# Patient Record
Sex: Male | Born: 1950 | Race: Black or African American | Hispanic: No | Marital: Single | Smoking: Former smoker
Health system: Southern US, Community
[De-identification: ages and names within clinical notes are randomized; demographics above are authoritative.]

## PROBLEM LIST (undated history)

## (undated) DIAGNOSIS — E039 Hypothyroidism, unspecified: Secondary | ICD-10-CM

## (undated) DIAGNOSIS — M1990 Unspecified osteoarthritis, unspecified site: Secondary | ICD-10-CM

## (undated) DIAGNOSIS — E079 Disorder of thyroid, unspecified: Secondary | ICD-10-CM

## (undated) HISTORY — PX: NO PAST SURGERIES: SHX2092

---

## 1999-11-02 ENCOUNTER — Other Ambulatory Visit: Admission: RE | Admit: 1999-11-02 | Discharge: 1999-11-02 | Payer: Self-pay | Admitting: Gastroenterology

## 2003-04-25 ENCOUNTER — Emergency Department (HOSPITAL_COMMUNITY): Admission: EM | Admit: 2003-04-25 | Discharge: 2003-04-25 | Payer: Self-pay | Admitting: Emergency Medicine

## 2019-09-29 ENCOUNTER — Inpatient Hospital Stay (HOSPITAL_COMMUNITY)
Admission: EM | Admit: 2019-09-29 | Discharge: 2019-10-01 | DRG: 176 | Disposition: A | Discharge status: Home / Self Care | Payer: No Typology Code available for payment source | Attending: Family Medicine | Admitting: Family Medicine

## 2019-09-29 ENCOUNTER — Encounter (HOSPITAL_COMMUNITY): Payer: Self-pay

## 2019-09-29 ENCOUNTER — Emergency Department (HOSPITAL_COMMUNITY): Payer: No Typology Code available for payment source

## 2019-09-29 ENCOUNTER — Other Ambulatory Visit: Payer: Self-pay

## 2019-09-29 DIAGNOSIS — I2699 Other pulmonary embolism without acute cor pulmonale: Secondary | ICD-10-CM | POA: Diagnosis not present

## 2019-09-29 DIAGNOSIS — F1020 Alcohol dependence, uncomplicated: Secondary | ICD-10-CM | POA: Diagnosis present

## 2019-09-29 DIAGNOSIS — Z20822 Contact with and (suspected) exposure to covid-19: Secondary | ICD-10-CM | POA: Diagnosis present

## 2019-09-29 DIAGNOSIS — E039 Hypothyroidism, unspecified: Secondary | ICD-10-CM | POA: Diagnosis not present

## 2019-09-29 DIAGNOSIS — Z79899 Other long term (current) drug therapy: Secondary | ICD-10-CM

## 2019-09-29 DIAGNOSIS — Z720 Tobacco use: Secondary | ICD-10-CM

## 2019-09-29 DIAGNOSIS — F17290 Nicotine dependence, other tobacco product, uncomplicated: Secondary | ICD-10-CM | POA: Diagnosis present

## 2019-09-29 DIAGNOSIS — Z7289 Other problems related to lifestyle: Secondary | ICD-10-CM | POA: Diagnosis not present

## 2019-09-29 DIAGNOSIS — Z79890 Hormone replacement therapy (postmenopausal): Secondary | ICD-10-CM

## 2019-09-29 DIAGNOSIS — Z789 Other specified health status: Secondary | ICD-10-CM

## 2019-09-29 DIAGNOSIS — E876 Hypokalemia: Secondary | ICD-10-CM | POA: Diagnosis present

## 2019-09-29 DIAGNOSIS — F1090 Alcohol use, unspecified, uncomplicated: Secondary | ICD-10-CM

## 2019-09-29 HISTORY — DX: Hypothyroidism, unspecified: E03.9

## 2019-09-29 HISTORY — DX: Unspecified osteoarthritis, unspecified site: M19.90

## 2019-09-29 HISTORY — DX: Disorder of thyroid, unspecified: E07.9

## 2019-09-29 LAB — MAGNESIUM: Magnesium: 2.1 mg/dL (ref 1.7–2.4)

## 2019-09-29 LAB — TROPONIN I (HIGH SENSITIVITY)
Troponin I (High Sensitivity): 5 ng/L (ref <18)
Troponin I (High Sensitivity): 3 ng/L (ref <18)

## 2019-09-29 LAB — CBC
HCT: 50.3 % (ref 39.0–52.0)
Hemoglobin: 16.5 g/dL (ref 13.0–17.0)
MCH: 31.1 pg (ref 26.0–34.0)
MCHC: 32.8 g/dL (ref 30.0–36.0)
MCV: 94.9 fL (ref 80.0–100.0)
Platelets: 233 10*3/uL (ref 150–400)
RBC: 5.3 MIL/uL (ref 4.22–5.81)
RDW: 17.5 % — ABNORMAL HIGH (ref 11.5–15.5)
WBC: 8 10*3/uL (ref 4.0–10.5)
nRBC: 0 % (ref 0.0–0.2)

## 2019-09-29 LAB — BASIC METABOLIC PANEL
Anion gap: 13 (ref 5–15)
BUN: <5 mg/dL — ABNORMAL LOW (ref 8–23)
CO2: 24 mmol/L (ref 22–32)
Calcium: 9.1 mg/dL (ref 8.9–10.3)
Chloride: 99 mmol/L (ref 98–111)
Creatinine, Ser: 0.88 mg/dL (ref 0.61–1.24)
GFR calc Af Amer: >60 mL/min (ref >60)
GFR calc non Af Amer: >60 mL/min (ref >60)
Glucose, Bld: 88 mg/dL (ref 70–99)
Potassium: 3.8 mmol/L (ref 3.5–5.1)
Sodium: 136 mmol/L (ref 135–145)

## 2019-09-29 LAB — PHOSPHORUS: Phosphorus: 3.6 mg/dL (ref 2.5–4.6)

## 2019-09-29 LAB — D-DIMER, QUANTITATIVE: D-Dimer, Quant: 4.61 ug/mL-FEU — ABNORMAL HIGH (ref 0.00–0.50)

## 2019-09-29 LAB — SARS CORONAVIRUS 2 BY RT PCR (HOSPITAL ORDER, PERFORMED IN ~~LOC~~ HOSPITAL LAB): SARS Coronavirus 2: NEGATIVE

## 2019-09-29 MED ORDER — LORAZEPAM 2 MG/ML IJ SOLN: 1.0000 mg | INTRAMUSCULAR | Status: DC | PRN

## 2019-09-29 MED ORDER — FOLIC ACID 1 MG PO TABS
1.0000 mg | ORAL_TABLET | Freq: Every day | ORAL | Status: DC
  Administered 2019-09-30 – 2019-10-01 (×2): 1 mg via ORAL
  Filled 2019-09-29 (×2): qty 1

## 2019-09-29 MED ORDER — PROMETHAZINE HCL 25 MG PO TABS: 12.5000 mg | ORAL_TABLET | Freq: Four times a day (QID) | ORAL | Status: DC | PRN

## 2019-09-29 MED ORDER — HEPARIN BOLUS VIA INFUSION
4000.0000 [IU] | Freq: Once | INTRAVENOUS | Status: AC
  Administered 2019-09-29: 4000 [IU] via INTRAVENOUS
  Filled 2019-09-29: qty 4000

## 2019-09-29 MED ORDER — NICOTINE 14 MG/24HR TD PT24
14.0000 mg | MEDICATED_PATCH | Freq: Every day | TRANSDERMAL | Status: DC
  Administered 2019-09-30 – 2019-10-01 (×2): 14 mg via TRANSDERMAL
  Filled 2019-09-29 (×3): qty 1

## 2019-09-29 MED ORDER — IOHEXOL 350 MG/ML SOLN
100.0000 mL | Freq: Once | INTRAVENOUS | Status: AC | PRN
  Administered 2019-09-29: 100 mL via INTRAVENOUS

## 2019-09-29 MED ORDER — HYDROCODONE-ACETAMINOPHEN 5-325 MG PO TABS: 1.0000 | ORAL_TABLET | ORAL | Status: DC | PRN

## 2019-09-29 MED ORDER — LORAZEPAM 1 MG PO TABS: 1.0000 mg | ORAL_TABLET | ORAL | Status: DC | PRN

## 2019-09-29 MED ORDER — ALBUTEROL SULFATE (2.5 MG/3ML) 0.083% IN NEBU: 3.0000 mL | INHALATION_SOLUTION | RESPIRATORY_TRACT | Status: DC | PRN

## 2019-09-29 MED ORDER — THIAMINE HCL 100 MG/ML IJ SOLN
Freq: Once | INTRAVENOUS | Status: AC
  Filled 2019-09-29: qty 1000

## 2019-09-29 MED ORDER — IBUPROFEN 200 MG PO TABS: 400.0000 mg | ORAL_TABLET | Freq: Four times a day (QID) | ORAL | Status: DC | PRN

## 2019-09-29 MED ORDER — BENZONATATE 100 MG PO CAPS
100.0000 mg | ORAL_CAPSULE | Freq: Once | ORAL | Status: AC
  Administered 2019-09-29: 100 mg via ORAL
  Filled 2019-09-29: qty 1

## 2019-09-29 MED ORDER — ADULT MULTIVITAMIN W/MINERALS CH
1.0000 | ORAL_TABLET | Freq: Every day | ORAL | Status: DC
  Administered 2019-09-30 – 2019-10-01 (×2): 1 via ORAL
  Filled 2019-09-29 (×2): qty 1

## 2019-09-29 MED ORDER — HEPARIN (PORCINE) 25000 UT/250ML-% IV SOLN
1500.0000 [IU]/h | INTRAVENOUS | Status: DC
  Administered 2019-09-29: 1200 [IU]/h via INTRAVENOUS
  Administered 2019-09-30 – 2019-10-01 (×2): 1500 [IU]/h via INTRAVENOUS
  Filled 2019-09-29 (×3): qty 250

## 2019-09-29 MED ORDER — ASPIRIN EC 81 MG PO TBEC
81.0000 mg | DELAYED_RELEASE_TABLET | Freq: Every day | ORAL | Status: DC
  Administered 2019-09-29 – 2019-10-01 (×3): 81 mg via ORAL
  Filled 2019-09-29 (×3): qty 1

## 2019-09-29 MED ORDER — THIAMINE HCL 100 MG PO TABS
100.0000 mg | ORAL_TABLET | Freq: Every day | ORAL | Status: DC
  Administered 2019-09-30 – 2019-10-01 (×2): 100 mg via ORAL
  Filled 2019-09-29 (×2): qty 1

## 2019-09-29 NOTE — ED Provider Notes (Signed)
[facility] EMERGENCY DEPARTMENT Provider Note   CSN: [id number] Arrival date & time: 09/29/19  4098     History Chief Complaint  Patient presents with  . Shortness of Breath  . Chest Pain    Chad Meyer is a 69 y.o. male.  The history is provided by the patient. No language interpreter was used.  Shortness of Breath Associated symptoms: chest pain   Chest Pain Associated symptoms: shortness of breath      69 year old male with history of thyroid disease presents ED for evaluation of shortness of breath.  Patient report acute onset of right-sided pleuritic chest pain and cough productive with phlegm that started approximately 4 days ago.  Pain is moderate in severity worse when he coughs and he is endorsing shortness of breath.  Pain is nonradiating, no hemoptysis no leg swelling or calf pain.  Denies any nausea vomiting diarrhea loss of taste or smell.  He has had his Covid vaccination.  He does smoke blacking out.  He denies any prior history of DVT PE no recent surgery or prolonged bedrest.  No active cancer.  Patient felt his symptoms started when he had a fan blowing on him while he was sleeping several days prior.  He also mentioned his roommate tested positive for COVID-19 5 months ago.  He denies any specific treatment tried at home.  Past Medical History:  Diagnosis Date  . Thyroid disease     There are no problems to display for this patient.   The histories are not reviewed yet. Please review them in the "History" navigator section and refresh this SmartLink.     No family history on file.  Social History   Tobacco Use  . Smoking status: Not on file  Substance Use Topics  . Alcohol use: Not on file  . Drug use: Not on file    Home Medications Prior to Admission medications   Not on File    Allergies    Patient has no allergy information on record.  Review of Systems   Review of Systems  Respiratory: Positive for shortness of  breath.   Cardiovascular: Positive for chest pain.  All other systems reviewed and are negative.   Physical Exam Updated Vital Signs BP (!) 141/76 (BP Location: Right Arm)   Pulse 82   Temp 99.3 F (37.4 C) (Oral)   Resp (!) 21   Ht 6' (1.829 m)   Wt 70.3 kg   SpO2 97%   BMI 21.02 kg/m   Physical Exam Vitals and nursing note reviewed.  Constitutional:      General: He is not in acute distress.    Appearance: He is well-developed.  HENT:     Head: Atraumatic.  Eyes:     Conjunctiva/sclera: Conjunctivae normal.  Cardiovascular:     Rate and Rhythm: Normal rate and regular rhythm.  Pulmonary:     Effort: Pulmonary effort is normal.     Breath sounds: Examination of the right-lower field reveals rales. Rales present. No wheezing or rhonchi.  Chest:     Chest wall: No tenderness.  Abdominal:     Palpations: Abdomen is soft.     Tenderness: There is no abdominal tenderness.  Musculoskeletal:     Cervical back: Neck supple.     Right lower leg: No edema.     Left lower leg: No edema.  Skin:    Findings: No rash.  Neurological:     Mental Status: He is alert and  oriented to person, place, and time.  Psychiatric:        Mood and Affect: Mood normal.     ED Results / Procedures / Treatments   Labs (all labs ordered are listed, but only abnormal results are displayed) Labs Reviewed  BASIC METABOLIC PANEL - Abnormal; Notable for the following components:      Result Value   BUN <5 (*)    All other components within normal limits  CBC - Abnormal; Notable for the following components:   RDW 17.5 (*)    All other components within normal limits  D-DIMER, QUANTITATIVE (NOT AT [facility]) - Abnormal; Notable for the following components:   D-Dimer, Quant 4.61 (*)    All other components within normal limits  SARS CORONAVIRUS 2 BY RT PCR (HOSPITAL ORDER, PERFORMED IN [facility])  TROPONIN I (HIGH SENSITIVITY)  TROPONIN I (HIGH SENSITIVITY)    EKG EKG  Interpretation  Date/Time:  Wednesday September 29 2019 09:05:25 EDT Ventricular Rate:  98 PR Interval:  160 QRS Duration: 70 QT Interval:  350 QTC Calculation: 446 R Axis:   21 Text Interpretation: Sinus rhythm with occasional Premature ventricular complexes Right atrial enlargement Anteroseptal infarct , age undetermined Abnormal ECG Confirmed by Virgina Norfolk [phone]) on 09/29/2019 3:37:14 PM   Radiology DG Chest 2 View  Addendum Date: 09/29/2019   ADDENDUM REPORT: 09/29/2019 10:20 ADDENDUM: With a more focal opacity in the RIGHT upper lobe infection is favored. Suggest follow-up to ensure resolution. These results were called by telephone at the time of interpretation on 09/29/2019 at 10:20 am to provider [facility] , who verbally acknowledged these results. Electronically Signed   By: Donzetta Kohut M.D.   On: 09/29/2019 10:20   Result Date: 09/29/2019 CLINICAL DATA:  RIGHT lateral chest pain soreness and shortness of breath with productive cough for 3-4 days EXAM: CHEST - 2 VIEW COMPARISON:  None FINDINGS: Trachea is midline. Cardiomediastinal contours are normal. Mild fullness of RIGHT and LEFT hila. Inter stool markings are increased bilaterally with hazy density in the RIGHT upper chest. No lobar consolidation. No sign of pleural effusion. Convex margin along the RIGHT heart border overlying the RIGHT hilum likely ectatic thoracic aorta. On limited assessment visualized skeletal structures without acute process. IMPRESSION: Increased interstitial markings suggested throughout without signs of edema or effusion. Hazy density in the RIGHT upper chest as described. Findings raise the question of background interstitial disease or though could be seen with atypical infection or asymmetric edema. Aortic ectasia. Electronically Signed: By: Donzetta Kohut M.D. On: 09/29/2019 09:37    Procedures .Critical Care Performed by: Fayrene Helper, PA-C Authorized by: Fayrene Helper, PA-C   Critical care provider  statement:    Critical care time (minutes):  35   Critical care was time spent personally by me on the following activities:  Discussions with consultants, evaluation of patient's response to treatment, examination of patient, ordering and performing treatments and interventions, ordering and review of laboratory studies, ordering and review of radiographic studies, pulse oximetry, re-evaluation of patient's condition, obtaining history from patient or surrogate and review of old charts   (including critical care time)  Medications Ordered in ED Medications  benzonatate (TESSALON) capsule 100 mg (100 mg Oral Given 09/29/19 1623)  iohexol (OMNIPAQUE) 350 MG/ML injection 100 mL (100 mLs Intravenous Contrast Given 09/29/19 1821)    ED Course  I have reviewed the triage vital signs and the nursing notes.  Pertinent labs & imaging results that were available  during my care of the patient were reviewed by me and considered in my medical decision making (see chart for details).    MDM Rules/Calculators/A&P                          BP 137/67   Pulse 70   Temp 98.4 F (36.9 C) (Oral)   Resp (!) 25   Ht 6' (1.829 m)   Wt 70.3 kg   SpO2 96%   BMI 21.02 kg/m   Final Clinical Impression(s) / ED Diagnoses Final diagnoses:  Pulmonary embolism with infarction (HCC)    Rx / DC Orders ED Discharge Orders    None     4:17 PM Patient complaining of right-sided pleuritic chest pain and cough for the past several days.  Labs are reassuring.  Chest x-ray shows a focal opacity to the right upper lobe suggesting infection.  We will also obtain D-dimer to assess for potential PE.  6:58 PM D-dimer is markedly elevated at 4.61.  A subsequent chest CT angiogram obtained demonstrating right upper lobe pulmonary embolism with pulmonary infarct consistent with patient's presentation. Pt does have signs of Right heart strain, will start heparin and will consult for admission.  His COVID-19 test is negative.     8:44 PM I have consulted critical care intensivist, Dr. Raeford Razor who request medicine for admission.  Echocardiogram ordered.  Patient started on heparin.  Appreciate consultation from [facility] hospitalist, Dr. Rachael Darby who agrees to admit patient for further management.  Patient voiced understanding and agrees with plan.  BP (!) 153/88   Pulse 82   Temp 98.4 F (36.9 C) (Oral)   Resp 19   Ht 6' (1.829 m)   Wt 70.3 kg   SpO2 97%   BMI 21.02 kg/m   Chad Meyer was evaluated in Emergency Department on 09/29/2019 for the symptoms described in the history of present illness. He was evaluated in the context of the global COVID-19 pandemic, which necessitated consideration that the patient might be at risk for infection with the SARS-CoV-2 virus that causes COVID-19. Institutional protocols and algorithms that pertain to the evaluation of patients at risk for COVID-19 are in a state of rapid change based on information released by regulatory bodies including the CDC and federal and state organizations. These policies and algorithms were followed during the patient's care in the ED.    Fayrene Helper, PA-C 09/29/19 2045    Virgina Norfolk, DO 09/29/19 2220

## 2019-09-29 NOTE — H&P (Signed)
History and Physical    Chad Meyer ZOX:[id number] DOB: 1950/04/14 DOA: 09/29/2019  PCP: Patient, No Pcp Per (Confirm with patient/family/NH records and if not entered, this has to be [facility]) Patient coming from: Home   I have personally briefly reviewed patient's old medical records in [facility]  Chief Complaint: Shortness of breath  HPI: Chad Meyer is a 69 y.o. male with medical history significant of hypothyroidism, alcohol use, osteoarthritis who presents from home with complaint of shortness of breath.  He reports that when he woke up this morning he felt short of breath with a sharp pain in the right lower lateral chest region.  He states if he sat down and rested he was able to breathe fine but if he got up and walked or exert himself he would become short of breath.  As of breath improves when he sits down and rest.  Reports shortness of breath is mild to moderate at its worst.  He has not had any swelling of his legs or leg pain.  He also reports a cough with some thick white sputum which is not normal for him.  He denies any chest pressure or palpitations.  States he has not had any recent travel or prolonged immobilization.  Reports he has never had blood clots in the past.  Reports he does take a multivitamin a day vitamin D and Synthroid and has been compliant with his medications.  He lives alone.  He smokes 3-4 cigars a day.  Reports he drinks 2-4  24 ounce beers most days of the week.  He states he has never had withdrawals or DTs if he does not drink.  He denies illicit drug use.    ED Course: Mr. Chad Meyer presented with shortness of breath.  He has not been hypoxic and did not require oxygen in the emergency room.  He had negative troponin, negative COVID-19 swab.  He has elevated D-dimer level.  He has been hemodynamically stable.  CT angiography was obtained which showed submassive PE with right heart strain.  ER provider discussed with critical care  who did not feel patient required ICU admission with no hypoxia or oxygen requirement.  Patient was started heparin infusion in the emergency room  Review of Systems:  General: Denies weakness, fever, chills, weight loss, night sweats.  Denies dizziness.  Eyes change in appetite HENT: Denies head trauma, headache, denies change in hearing, tinnitus.  Denies nasal congestion or bleeding.  Denies sore throat, sores in mouth.  Denies difficulty swallowing Eyes: Denies blurry vision, pain in eye, drainage.  Denies discoloration of eyes. Neck: Denies pain.  Denies swelling.  Denies pain with movement. Cardiovascular: Reports mild to moderate right lower lateral chest pain.denies palpitations.  Denies edema.  Denies orthopnea Respiratory: Reports shortness of breath.  Reports productive cough.  Denies wheezing.  Gastrointestinal: Denies abdominal pain, swelling.  Denies nausea, vomiting, diarrhea.  Denies melena.  Denies hematemesis. Musculoskeletal: Denies limitation of movement.  Denies deformity or swelling.  Denies pain.  Denies arthralgias or myalgias. Genitourinary: Denies pelvic pain.  Denies urinary frequency or hesitancy.  Denies dysuria.  Skin: Denies rash.  Denies petechiae, purpura, ecchymosis. Neurological: Denies headache.  Denies syncope.  Denies seizure activity.  Denies weakness or paresthesia.  Slurred speech, drooping face.  Denies visual change. Psychiatric: Denies depression, anxiety.  Denies suicidal thoughts or ideation.  Denies hallucinations.  Past Medical History:  Diagnosis Date  . Arthritis   . Hypothyroidism   .  Thyroid disease     Past Surgical History:  Procedure Laterality Date  . NO PAST SURGERIES      Social History  reports that he has been smoking cigars. He has never used smokeless tobacco. He reports current alcohol use. He reports that he does not use drugs.  No Known Allergies  Family History  Problem Relation Age of Onset  . Hypertension Mother    . Hypertension Father   . CAD Father     Prior to Admission medications   Medication Sig Start Date End Date Taking? Authorizing Provider  Cholecalciferol (VITAMIN D3) 50 MCG (2000 UT) TABS Take 2,000 Units by mouth daily.   Yes [provider]  levothyroxine (SYNTHROID) 125 MCG tablet Take 250 mcg by mouth daily before breakfast.   Yes [provider]  sildenafil (VIAGRA) 100 MG tablet Take 50 mg by mouth daily as needed for erectile dysfunction.   Yes [provider]  vitamin B-12 (CYANOCOBALAMIN) 500 MCG tablet Take 500 mcg by mouth daily.   Yes [provider]    Physical Exam: Vitals:   09/29/19 1630 09/29/19 1700 09/29/19 1900 09/29/19 1915  BP: 138/76 137/67 (!) 142/80 (!) 153/88  Pulse: 76 70 81 82  Resp: (!) 21 (!) 25 (!) 29 19  Temp: 98.4 F (36.9 C)     TempSrc: Oral     SpO2: 98% 96% 98% 97%  Weight:      Height:         Constitutional: WDWN, AA male. NAD, calm, comfortable Vitals:   09/29/19 1630 09/29/19 1700 09/29/19 1900 09/29/19 1915  BP: 138/76 137/67 (!) 142/80 (!) 153/88  Pulse: 76 70 81 82  Resp: (!) 21 (!) 25 (!) 29 19  Temp: 98.4 F (36.9 C)     TempSrc: Oral     SpO2: 98% 96% 98% 97%  Weight:      Height:       Eyes: PERRL, lids and conjunctivae normal.  Sclera nonicteric.  No drainage ENMT:  [place],AT.  Nares patent without epistasis.  Ears without external lesions.  Mucous membranes are moist. Posterior pharynx clear of any exudate or lesions. Normal dentition.  Neck: Soft, supple, no masses, no thyromegaly.  Trachea midline Respiratory: Breath sounds equal clear.  No rales.  No wheezing, no crackles. Normal respiratory effort. No accessory muscle use.  Cardiovascular: Regular rate and rhythm, no murmurs / rubs / gallops. No extremity edema. 2+ pedal pulses. No carotid bruits.  PMI nondisplaced. Abdomen: Soft.  No tenderness, nondistended.  No masses palpated. No hepatosplenomegaly. Bowel sounds  normoactive Musculoskeletal: Normal range of motion, has clubbing of digits.  No cyanosis. No joint deformity upper and lower extremities. Normal muscle tone.  Negative Homans' sign bilaterally. Skin: Warm, dry, intact.  No rashes, lesions, ulcers. No induration.  No petechiae or purpura. Neurologic: CN 2-12 grossly intact.  Normal speech.  Sensation intact, patella DTR +2 bilaterally. Strength 5/5 in all extremities.  Psychiatric: Normal judgment and insight. Alert and oriented x 3. Normal mood.   PESI score is 78, low risk with a 1.7-3.5% 30 day mortality risk.    Labs on Admission: I have personally reviewed following labs and imaging studies  CBC: Recent Labs  Lab 09/29/19 0907  WBC 8.0  HGB 16.5  HCT 50.3  MCV 94.9  PLT 233    Basic Metabolic Panel: Recent Labs  Lab 09/29/19 0907  NA 136  K 3.8  CL 99  CO2 24  GLUCOSE  88  BUN <5*  CREATININE 0.88  CALCIUM 9.1    GFR: Estimated Creatinine Clearance: 79.9 mL/min (by C-G formula based on SCr of 0.88 mg/dL).  Radiological Exams on Admission: DG Chest 2 View  Addendum Date: 09/29/2019   ADDENDUM REPORT: 09/29/2019 10:20 ADDENDUM: With a more focal opacity in the RIGHT upper lobe infection is favored. Suggest follow-up to ensure resolution. These results were called by telephone at the time of interpretation on 09/29/2019 at 10:20 am to provider [facility] , who verbally acknowledged these results. Electronically Signed   By: Donzetta Kohut M.D.   On: 09/29/2019 10:20   Result Date: 09/29/2019 CLINICAL DATA:  RIGHT lateral chest pain soreness and shortness of breath with productive cough for 3-4 days EXAM: CHEST - 2 VIEW COMPARISON:  None FINDINGS: Trachea is midline. Cardiomediastinal contours are normal. Mild fullness of RIGHT and LEFT hila. Inter stool markings are increased bilaterally with hazy density in the RIGHT upper chest. No lobar consolidation. No sign of pleural effusion. Convex margin along the RIGHT heart  border overlying the RIGHT hilum likely ectatic thoracic aorta. On limited assessment visualized skeletal structures without acute process. IMPRESSION: Increased interstitial markings suggested throughout without signs of edema or effusion. Hazy density in the RIGHT upper chest as described. Findings raise the question of background interstitial disease or though could be seen with atypical infection or asymmetric edema. Aortic ectasia. Electronically Signed: By: Donzetta Kohut M.D. On: 09/29/2019 09:37   CT Angio Chest PE W and/or Wo Contrast  Result Date: 09/29/2019 CLINICAL DATA:  Shortness of breath and chest pain. EXAM: CT ANGIOGRAPHY CHEST WITH CONTRAST TECHNIQUE: Multidetector CT imaging of the chest was performed using the standard protocol during bolus administration of intravenous contrast. Multiplanar CT image reconstructions and MIPs were obtained to evaluate the vascular anatomy. CONTRAST:  OMNIPAQUE IOHEXOL 350 MG/ML SOLN COMPARISON:  None. FINDINGS: Cardiovascular: The heart size is normal. No substantial pericardial effusion. Coronary artery calcification is evident. Atherosclerotic calcification is noted in the wall of the thoracic aorta. Occlusive pulmonary embolus identified in segmental pulmonary arteries to the posterior right upper lobe. Nonocclusive filling defect identified in the left lower lobe pulmonary artery extending into segmental and subsegmental branches of the left lower lobe. Segmental and subsegmental pulmonary embolus is identified in the right lower lobe as well. Mediastinum/Nodes: No mediastinal lymphadenopathy. There is no hilar lymphadenopathy. The esophagus has normal imaging features. There is no axillary lymphadenopathy. Lungs/Pleura: Interstitial and patchy airspace disease is identified posterior right upper lobe compatible with infarct. Atelectasis noted in the lung bases. No pleural effusion. Upper Abdomen: Unremarkable. Musculoskeletal: No worrisome lytic or  sclerotic osseous abnormality. Review of the MIP images confirms the above findings. IMPRESSION: 1. Occlusive pulmonary embolus identified in segmental branches to the posterior right upper lobe with associated posterior right upper lobe pulmonary infarct. Nonocclusive pulmonary embolus in the left lower lobe pulmonary artery extending into segmental and subsegmental branches of the left lower lobe. Segmental and subsegmental pulmonary embolus identified in the right lower lobe, as well. CTevidence of right heart strain (RV/LV Ratio = 1.1) consistent with at least submassive (intermediate risk) PE. The presence of right heart strain has been associated with an increased risk of morbidity and mortality. 2. Aortic Atherosclerosis (ICD10-I70.0). Critical Value/emergent results were called by me at the time of interpretation on 09/29/2019 at 7:03 pm to provider [facility] , who verbally acknowledged these results. Electronically Signed   By: Kennith Center M.D.   On: 09/29/2019  19:04    EKG: Independently reviewed.  Normal sinus rhythm.  No acute ST elevation or depression  Assessment/Plan Principal Problem:   Acute pulmonary embolism (HCC) Patient replaced on cardiac telemetry for observation.  Patient started on heparin infusion in the emergency room which will be continued for anticoagulation.  The patient remained hemodynamically stable can be converted to Eliquis in the morning.  Obtain echocardiogram in the morning to evaluate wall motion, cardiac dynamics and ejection fraction. Obtain Doppler ultrasound to evaluate for DVTs in a.m. Pain control provided. Incentive spirometer every hour while awake. Albuterol MDI with spacer as needed for shortness of breath, cough, wheeze  2. Hypothyroidism Continue home dose of Synthroid.  3. Tobacco use  Nicotine patch provided to prevent agitation withdrawal from tobacco.  4. Alcohol Use We will monitor CIWA scores.  Patient started on multivitamin, thiamine  and folic acid daily.  Will be given 1 banana bag of IV fluid overnight. Patient develops agitation or early DTs with alcohol cessation will provide Ativan as needed.    DVT prophylaxis: Patient is on heparin infusion overnight.  Code Status:   Full code Family Communication: Diagnosis and plan discussed with patient.  Patient verbalized understanding and agrees with plan.  Further recommendations to follow as clinically indicated Disposition Plan:   Patient is from:  Home  Anticipated DC to:  Home  Anticipated DC date:  July 8  Anticipated DC barriers: None identified  Consults called:  None (with names) Admission status:  Observation on cardiac telemetry (inpatient / obs / tele / medical floor / SDU)  Severity of Illness: The appropriate patient status for this patient is OBSERVATION. Observation status is judged to be reasonable and necessary in order to provide the required intensity of service to ensure the patient's safety. The patient's presenting symptoms, physical exam findings, and initial radiographic and laboratory data in the context of their medical condition is felt to place them at decreased risk for further clinical deterioration. Furthermore, it is anticipated that the patient will be medically stable for discharge from the hospital within 2 midnights of admission. The following factors support the patient status of observation.   " The patient's presenting symptoms include shortness of breath . " The physical exam findings include normal exam. " The initial radiographic and laboratory data are elevated d-dimer level. CTA chest shows acute submassive PE with right heart strain.      Claudean Severance Kiannah Grunow MD [facility]  How to contact the [facility] Attending or Consulting provider 7A - 7P or covering provider during after hours 7P -7A, for this patient?   1. Check the care team in [facility] and look for a) attending/consulting [facility] provider listed and b) the [facility] team  listed 2. Log into [email or web address] and use [facility]'s universal password to access. If you do not have the password, please contact the hospital operator. 3. Locate the [facility] provider you are looking for under [facility] and page to a number that you can be directly reached. 4. If you still have difficulty reaching the provider, please page the [facility] (Director on Call) for the Hospitalists listed on amion for assistance.  09/29/2019, 9:33 PM

## 2019-09-29 NOTE — Progress Notes (Signed)
ANTICOAGULATION CONSULT NOTE - Initial Consult  Pharmacy Consult for heparin Indication: pulmonary embolus  Not on File  Patient Measurements: Height: 6' (182.9 cm) Weight: 70.3 kg (155 lb) IBW/kg (Calculated) : 77.6 Heparin Dosing Weight: 70 kg   Vital Signs: Temp: 98.4 F (36.9 C) (07/07 1630) Temp Source: Oral (07/07 1630) BP: 142/80 (07/07 1900) Pulse Rate: 81 (07/07 1900)  Labs: Recent Labs    09/29/19 0907 09/29/19 1600  HGB 16.5  --   HCT 50.3  --   PLT 233  --   CREATININE 0.88  --   TROPONINIHS 5 3    Estimated Creatinine Clearance: 79.9 mL/min (by C-G formula based on SCr of 0.88 mg/dL).   Medical History: Past Medical History:  Diagnosis Date  . Thyroid disease     Medications:  (Not in a hospital admission)   Assessment: 36 YOM who presents with shortness of breath. CT PE identified occlusive right sided PE and non-occlusive L sided PE with evidence of heart strain. H/H and Plt wnl.   Goal of Therapy:  Heparin level 0.3-0.7 units/ml Monitor platelets by anticoagulation protocol: Yes   Plan:  -Heparin 4000 units IV bolus followed IV heparin infusion at 1200 units/hr -F/u 6 hr HL -Monitor daily HL, CBC and s/s of bleeding   Vinnie Level, PharmD., BCPS, BCCCP Clinical Pharmacist Clinical phone for 09/29/19 until 11:30pm: [phone] If after 11:30pm, please refer to [facility] for unit-specific pharmacist

## 2019-09-29 NOTE — ED Triage Notes (Signed)
Pt reports waking up feeling sob and having some mild cp. Resp e.u at this time. Productive cough.

## 2019-09-29 NOTE — ED Provider Notes (Signed)
Medical screening examination/treatment/procedure(s) were conducted as a shared visit with non-physician practitioner(s) and myself.  I personally evaluated the patient during the encounter. Briefly, the patient is a 69 y.o. male with no significant medical history presents the ED with shortness of breath, chest pain.  Symptoms over the last day or so.  Patient with cough, sputum production.  Chest x-ray concerning for possible infection.  Troponin normal.  However D-dimer elevated.  CT scan showed bilateral PEs with right heart strain.  However patient hemodynamically stable.  On room air.  Covid is negative.  Will start heparin.  Echocardiogram ordered.  To be admitted to medicine.  Will touch base with ICU.  This chart was dictated using voice recognition software.  Despite best efforts to proofread,  errors can occur which can change the documentation meaning.     EKG Interpretation  Date/Time:  Wednesday September 29 2019 09:05:25 EDT Ventricular Rate:  98 PR Interval:  160 QRS Duration: 70 QT Interval:  350 QTC Calculation: 446 R Axis:   21 Text Interpretation: Sinus rhythm with occasional Premature ventricular complexes Right atrial enlargement Anteroseptal infarct , age undetermined Abnormal ECG Confirmed by Virgina Norfolk [phone]) on 09/29/2019 3:37:14 PM           Virgina Norfolk, DO 09/29/19 1924

## 2019-09-30 ENCOUNTER — Observation Stay (HOSPITAL_COMMUNITY): Payer: No Typology Code available for payment source

## 2019-09-30 ENCOUNTER — Encounter (HOSPITAL_COMMUNITY): Payer: Self-pay | Admitting: Family Medicine

## 2019-09-30 DIAGNOSIS — I2602 Saddle embolus of pulmonary artery with acute cor pulmonale: Secondary | ICD-10-CM

## 2019-09-30 DIAGNOSIS — E876 Hypokalemia: Secondary | ICD-10-CM | POA: Diagnosis present

## 2019-09-30 DIAGNOSIS — F1020 Alcohol dependence, uncomplicated: Secondary | ICD-10-CM | POA: Diagnosis present

## 2019-09-30 DIAGNOSIS — Z720 Tobacco use: Secondary | ICD-10-CM | POA: Diagnosis not present

## 2019-09-30 DIAGNOSIS — I2699 Other pulmonary embolism without acute cor pulmonale: Secondary | ICD-10-CM | POA: Diagnosis present

## 2019-09-30 DIAGNOSIS — F17290 Nicotine dependence, other tobacco product, uncomplicated: Secondary | ICD-10-CM | POA: Diagnosis present

## 2019-09-30 DIAGNOSIS — Z79899 Other long term (current) drug therapy: Secondary | ICD-10-CM | POA: Diagnosis not present

## 2019-09-30 DIAGNOSIS — Z20822 Contact with and (suspected) exposure to covid-19: Secondary | ICD-10-CM | POA: Diagnosis present

## 2019-09-30 DIAGNOSIS — E039 Hypothyroidism, unspecified: Secondary | ICD-10-CM | POA: Diagnosis present

## 2019-09-30 DIAGNOSIS — Z79890 Hormone replacement therapy (postmenopausal): Secondary | ICD-10-CM | POA: Diagnosis not present

## 2019-09-30 LAB — BASIC METABOLIC PANEL
Anion gap: 10 (ref 5–15)
BUN: 5 mg/dL — ABNORMAL LOW (ref 8–23)
CO2: 26 mmol/L (ref 22–32)
Calcium: 8.5 mg/dL — ABNORMAL LOW (ref 8.9–10.3)
Chloride: 100 mmol/L (ref 98–111)
Creatinine, Ser: 0.74 mg/dL (ref 0.61–1.24)
GFR calc Af Amer: >60 mL/min (ref >60)
GFR calc non Af Amer: >60 mL/min (ref >60)
Glucose, Bld: 111 mg/dL — ABNORMAL HIGH (ref 70–99)
Potassium: 3.3 mmol/L — ABNORMAL LOW (ref 3.5–5.1)
Sodium: 136 mmol/L (ref 135–145)

## 2019-09-30 LAB — CBC
HCT: 43.3 % (ref 39.0–52.0)
Hemoglobin: 14.7 g/dL (ref 13.0–17.0)
MCH: 31.7 pg (ref 26.0–34.0)
MCHC: 33.9 g/dL (ref 30.0–36.0)
MCV: 93.3 fL (ref 80.0–100.0)
Platelets: 203 10*3/uL (ref 150–400)
RBC: 4.64 MIL/uL (ref 4.22–5.81)
RDW: 17 % — ABNORMAL HIGH (ref 11.5–15.5)
WBC: 7.8 10*3/uL (ref 4.0–10.5)
nRBC: 0 % (ref 0.0–0.2)

## 2019-09-30 LAB — HEPARIN LEVEL (UNFRACTIONATED)
Heparin Unfractionated: <0.1 IU/mL — ABNORMAL LOW (ref 0.30–0.70)
Heparin Unfractionated: 0.51 IU/mL (ref 0.30–0.70)
Heparin Unfractionated: 0.37 IU/mL (ref 0.30–0.70)

## 2019-09-30 LAB — PROTIME-INR
INR: 1.1 (ref 0.8–1.2)
Prothrombin Time: 13.7 seconds (ref 11.4–15.2)

## 2019-09-30 MED ORDER — HYDROCODONE-ACETAMINOPHEN 5-325 MG PO TABS
1.0000 | ORAL_TABLET | ORAL | Status: DC
  Administered 2019-09-30 – 2019-10-01 (×6): 1 via ORAL
  Filled 2019-09-30 (×6): qty 1

## 2019-09-30 MED ORDER — HEPARIN BOLUS VIA INFUSION
2000.0000 [IU] | Freq: Once | INTRAVENOUS | Status: AC
  Administered 2019-09-30: 2000 [IU] via INTRAVENOUS
  Filled 2019-09-30: qty 2000

## 2019-09-30 MED ORDER — LEVOTHYROXINE SODIUM 50 MCG PO TABS
250.0000 ug | ORAL_TABLET | Freq: Every day | ORAL | Status: DC
  Administered 2019-09-30 – 2019-10-01 (×2): 250 ug via ORAL
  Filled 2019-09-30: qty 2
  Filled 2019-09-30: qty 1

## 2019-09-30 MED ORDER — LEVOTHYROXINE SODIUM 50 MCG PO TABS: 250.0000 ug | ORAL_TABLET | Freq: Every day | ORAL | Status: DC

## 2019-09-30 MED ORDER — POTASSIUM CHLORIDE CRYS ER 20 MEQ PO TBCR
40.0000 meq | EXTENDED_RELEASE_TABLET | Freq: Once | ORAL | Status: AC
  Administered 2019-09-30: 40 meq via ORAL
  Filled 2019-09-30: qty 2

## 2019-09-30 NOTE — ED Notes (Signed)
Secretary was requested to call [facility] for pt transport to [id number].

## 2019-09-30 NOTE — Plan of Care (Signed)

## 2019-09-30 NOTE — ED Notes (Signed)
Ordered breakfast--Chad Meyer 

## 2019-09-30 NOTE — Progress Notes (Signed)
ANTICOAGULATION CONSULT NOTE  Pharmacy Consult for heparin Indication: pulmonary embolus  No Known Allergies  Patient Measurements: Height: 6' (182.9 cm) Weight: 70.3 kg (155 lb) IBW/kg (Calculated) : 77.6 Heparin Dosing Weight: 70 kg   Vital Signs: BP: 125/71 (07/08 0930) Pulse Rate: 93 (07/08 0930)  Labs: Recent Labs    09/29/19 0907 09/29/19 1600 09/30/19 0200 09/30/19 0431 09/30/19 1115  HGB 16.5  --   --  14.7  --   HCT 50.3  --   --  43.3  --   PLT 233  --   --  203  --   LABPROT  --   --   --  13.7  --   INR  --   --   --  1.1  --   HEPARINUNFRC  --   --  <0.10*  --  0.51  CREATININE 0.88  --   --  0.74  --   TROPONINIHS 5 3  --   --   --     Estimated Creatinine Clearance: 87.9 mL/min (by C-G formula based on SCr of 0.74 mg/dL).   Assessment: 35 YOM who presents with shortness of breath. CT PE identified occlusive right sided PE and non-occlusive L sided PE with evidence of heart strain. H/H and Plt wnl.   Heparin level is therapeutic at 1500 units/hr.   Goal of Therapy:  Heparin level 0.3-0.7 units/ml Monitor platelets by anticoagulation protocol: Yes   Plan:  Continue heparin 1500 units/hr. F/u 1700 HL Monitor daily HL & CBC  Arletha Pili, PharmD Student

## 2019-09-30 NOTE — Progress Notes (Signed)
ANTICOAGULATION CONSULT NOTE  Pharmacy Consult for heparin Indication: pulmonary embolus  No Known Allergies  Patient Measurements: Height: 6' (182.9 cm) Weight: 70.3 kg (155 lb) IBW/kg (Calculated) : 77.6 Heparin Dosing Weight: 70 kg   Vital Signs: Temp: 98.4 F (36.9 C) (07/07 1630) Temp Source: Oral (07/07 1630) BP: 153/88 (07/07 1915) Pulse Rate: 82 (07/07 1915)  Labs: Recent Labs    09/29/19 0907 09/29/19 1600 09/30/19 0200  HGB 16.5  --   --   HCT 50.3  --   --   PLT 233  --   --   HEPARINUNFRC  --   --  <0.10*  CREATININE 0.88  --   --   TROPONINIHS 5 3  --     Estimated Creatinine Clearance: 79.9 mL/min (by C-G formula based on SCr of 0.88 mg/dL).   Assessment: 39 YOM who presents with shortness of breath. CT PE identified occlusive right sided PE and non-occlusive L sided PE with evidence of heart strain. H/H and Plt wnl.   Heparin level undetectable on gtt at 1200 units/hr. No issues with line or bleeding reported per RN.  Goal of Therapy:  Heparin level 0.3-0.7 units/ml Monitor platelets by anticoagulation protocol: Yes   Plan:  Rebolus heparin 2000 units and increase heparin infusion to 1500 units/hr F/u 6 hr HL  Christoper Fabian, PharmD, BCPS Please see amion for complete clinical pharmacist phone list 09/30/2019 3:35 AM

## 2019-09-30 NOTE — Progress Notes (Signed)
PROGRESS NOTE    Veron Senner  KKX:[id number] DOB: 09-Dec-1950 DOA: 09/29/2019 PCP: Patient, No Pcp Per  Brief Narrative:  69 year old male Hypothyroid post Radioactive iodine unclear chronic ethanolism, osteoarthritis Presented to [facility], ED pleuritic chest pain X 4 days D-dimer 4.6-CT angio pulmonary infarct pulmonary embolism right heart strain Critical care consulted who recommended hospitalist admission given hemodynamic stability Echocardiogram ordered, Dopplers ordered Patient placed on alcohol withdrawal precautions--drinks 2-3 24 Oz a day   Assessment & Plan:   Principal Problem:   Acute pulmonary embolism (HCC) Active Problems:   Hypothyroidism   Tobacco use   Alcohol use   1. Large pulmonary embolism with right heart strain a. Echocardiogram done but in process b. DVT ultrasound pending c. Only possible etiology could be either his risk of immobility with chronic ethanolism d. Given that he is already on heparin cannot really do further work-up-expect this can be done as an outpatient e. Have CCed TOC today to ensure affordability Eliquis/Xarelto as may need this otherwise will need to transition to high Coumadin with teaching and may need to go home on Lovenox bridge to Coumadin with teaching f. Repeat CBC a.m. g. Pain controlwith ibuprofen 1st choice and Hydrocodone 2. Hypothyroid post radioactive iodine a. Would not order TSH at this time b. Restart Synthroid 125 every morning c. Needs outpatient TSH in 1 to 2 weeks 3. Hypokalemia a. Replace potassium with K. Dur 20 today 4. Alcoholism a. Keep on CIWA 5. Tobacco abuse a. Offer nicotine patch   DVT prophylaxis:  Code Status: Full code Family Communication: None  Disposition:   Status is: Observation  The patient remains OBS appropriate and will d/c before 2 midnights.  Dispo: The patient is from: Home              Anticipated d/c is to: Home              Anticipated d/c date is: 1 day               Patient currently is not medically stable to d/c.   Consultants:   None as yet  Procedures: Echo pending Doppler lower extremity pending  Antimicrobials: None   Subjective: Awake alert no cough issue other than pain when moving around sitting up in bed and coughing No fever chills No blurred vision double vision  Objective: Vitals:   09/29/19 1700 09/29/19 1900 09/29/19 1915 09/30/19 0400  BP: 137/67 (!) 142/80 (!) 153/88 124/70  Pulse: 70 81 82 73  Resp: (!) 25 (!) 29 19 (!) 21  Temp:      TempSrc:      SpO2: 96% 98% 97% 96%  Weight:      Height:        Intake/Output Summary (Last 24 hours) at 09/30/2019 0727 Last data filed at 09/30/2019 0328 Gross per 24 hour  Intake --  Output 350 ml  Net -350 ml   Filed Weights   09/29/19 0904  Weight: 70.3 kg    Examination:  General exam: Awake alert moderate dentition slim build throat soft supple Respiratory system: Clinically clear no added sound no rales no rhonchi Cardiovascular system: S1-S2 no murmur rub or gallop Gastrointestinal system: Abdomen soft nontender no rebound. Central nervous system: Neurologically intact no focal deficit Extremities: Soft no lower extremity swelling Skin: Normal Psychiatry: Euthymic pleasant  Data Reviewed: I have personally reviewed following labs and imaging studies Potassium 3.3 BUN/creatinine 5/0.7 WBC 7.8 INR 1.1  Radiology Studies: DG Chest 2  View  Addendum Date: 09/29/2019   ADDENDUM REPORT: 09/29/2019 10:20 ADDENDUM: With a more focal opacity in the RIGHT upper lobe infection is favored. Suggest follow-up to ensure resolution. These results were called by telephone at the time of interpretation on 09/29/2019 at 10:20 am to provider [facility] , who verbally acknowledged these results. Electronically Signed   By: Donzetta Kohut M.D.   On: 09/29/2019 10:20   Result Date: 09/29/2019 CLINICAL DATA:  RIGHT lateral chest pain soreness and shortness of breath with productive  cough for 3-4 days EXAM: CHEST - 2 VIEW COMPARISON:  None FINDINGS: Trachea is midline. Cardiomediastinal contours are normal. Mild fullness of RIGHT and LEFT hila. Inter stool markings are increased bilaterally with hazy density in the RIGHT upper chest. No lobar consolidation. No sign of pleural effusion. Convex margin along the RIGHT heart border overlying the RIGHT hilum likely ectatic thoracic aorta. On limited assessment visualized skeletal structures without acute process. IMPRESSION: Increased interstitial markings suggested throughout without signs of edema or effusion. Hazy density in the RIGHT upper chest as described. Findings raise the question of background interstitial disease or though could be seen with atypical infection or asymmetric edema. Aortic ectasia. Electronically Signed: By: Donzetta Kohut M.D. On: 09/29/2019 09:37   CT Angio Chest PE W and/or Wo Contrast  Result Date: 09/29/2019 CLINICAL DATA:  Shortness of breath and chest pain. EXAM: CT ANGIOGRAPHY CHEST WITH CONTRAST TECHNIQUE: Multidetector CT imaging of the chest was performed using the standard protocol during bolus administration of intravenous contrast. Multiplanar CT image reconstructions and MIPs were obtained to evaluate the vascular anatomy. CONTRAST:  OMNIPAQUE IOHEXOL 350 MG/ML SOLN COMPARISON:  None. FINDINGS: Cardiovascular: The heart size is normal. No substantial pericardial effusion. Coronary artery calcification is evident. Atherosclerotic calcification is noted in the wall of the thoracic aorta. Occlusive pulmonary embolus identified in segmental pulmonary arteries to the posterior right upper lobe. Nonocclusive filling defect identified in the left lower lobe pulmonary artery extending into segmental and subsegmental branches of the left lower lobe. Segmental and subsegmental pulmonary embolus is identified in the right lower lobe as well. Mediastinum/Nodes: No mediastinal lymphadenopathy. There is no hilar  lymphadenopathy. The esophagus has normal imaging features. There is no axillary lymphadenopathy. Lungs/Pleura: Interstitial and patchy airspace disease is identified posterior right upper lobe compatible with infarct. Atelectasis noted in the lung bases. No pleural effusion. Upper Abdomen: Unremarkable. Musculoskeletal: No worrisome lytic or sclerotic osseous abnormality. Review of the MIP images confirms the above findings. IMPRESSION: 1. Occlusive pulmonary embolus identified in segmental branches to the posterior right upper lobe with associated posterior right upper lobe pulmonary infarct. Nonocclusive pulmonary embolus in the left lower lobe pulmonary artery extending into segmental and subsegmental branches of the left lower lobe. Segmental and subsegmental pulmonary embolus identified in the right lower lobe, as well. CTevidence of right heart strain (RV/LV Ratio = 1.1) consistent with at least submassive (intermediate risk) PE. The presence of right heart strain has been associated with an increased risk of morbidity and mortality. 2. Aortic Atherosclerosis (ICD10-I70.0). Critical Value/emergent results were called by me at the time of interpretation on 09/29/2019 at 7:03 pm to provider [facility] , who verbally acknowledged these results. Electronically Signed   By: Kennith Center M.D.   On: 09/29/2019 19:04     Scheduled Meds: . aspirin EC  81 mg Oral Daily  . folic acid  1 mg Oral Daily  . multivitamin with minerals  1 tablet Oral Daily  .  nicotine  14 mg Transdermal Daily  . thiamine  100 mg Oral Daily   Continuous Infusions: . heparin 1,500 Units/hr (09/30/19 0408)     LOS: 0 days    Time spent: 104  Rhetta Mura, MD [facility] To contact the attending provider between 7A-7P or the covering provider during after hours 7P-7A, please log into the web site [email or web address] and access using universal [place] password for that web site. If you do not have the password,  please call the hospital operator.  09/30/2019, 7:27 AM

## 2019-09-30 NOTE — ED Notes (Signed)
synthroid dose is not showing up in the "med list" on the pyxis, ED Pharmacist contacted, she attempted to fix the issue. Waiting on the med to be sent up from main pharmacy, pharmacy has been sent a notification about the dose needed.

## 2019-09-30 NOTE — Progress Notes (Signed)
°  Echocardiogram 2D Echocardiogram has been performed.  Chad Meyer 09/30/2019, 8:34 AM

## 2019-09-30 NOTE — ED Notes (Signed)
[facility] here to transfer to [facility]

## 2019-09-30 NOTE — ED Notes (Signed)
No distress no pain until he coughs

## 2019-09-30 NOTE — Progress Notes (Signed)
Pharmacy Brief Note - Anticoagulation Follow Up:  Pt is on heparin drip for PE.   Assessment:  Confirmatory HL = 0.37 remains therapeutic on heparin infusion of 1500 units/hr  Confirmed with RN that heparin infusing at correct rate with no interruptions. No signs of bleeding.   Goal: Heparin level 0.3 - 0.7  Plan:  Continue heparin infusion at current rate of 1500 units/hr  CBC, HL daily. Recheck with AM labs tomorrow.   Cindi Carbon, PharmD 09/30/19 7:15 PM

## 2019-10-01 DIAGNOSIS — Z720 Tobacco use: Secondary | ICD-10-CM

## 2019-10-01 DIAGNOSIS — E039 Hypothyroidism, unspecified: Secondary | ICD-10-CM

## 2019-10-01 LAB — BASIC METABOLIC PANEL
Anion gap: 8 (ref 5–15)
BUN: 6 mg/dL — ABNORMAL LOW (ref 8–23)
CO2: 27 mmol/L (ref 22–32)
Calcium: 8.7 mg/dL — ABNORMAL LOW (ref 8.9–10.3)
Chloride: 101 mmol/L (ref 98–111)
Creatinine, Ser: 0.57 mg/dL — ABNORMAL LOW (ref 0.61–1.24)
GFR calc Af Amer: >60 mL/min (ref >60)
GFR calc non Af Amer: >60 mL/min (ref >60)
Glucose, Bld: 122 mg/dL — ABNORMAL HIGH (ref 70–99)
Potassium: 3.6 mmol/L (ref 3.5–5.1)
Sodium: 136 mmol/L (ref 135–145)

## 2019-10-01 LAB — CBC
HCT: 42.7 % (ref 39.0–52.0)
Hemoglobin: 14.3 g/dL (ref 13.0–17.0)
MCH: 31.6 pg (ref 26.0–34.0)
MCHC: 33.5 g/dL (ref 30.0–36.0)
MCV: 94.3 fL (ref 80.0–100.0)
Platelets: 198 10*3/uL (ref 150–400)
RBC: 4.53 MIL/uL (ref 4.22–5.81)
RDW: 16.7 % — ABNORMAL HIGH (ref 11.5–15.5)
WBC: 10 10*3/uL (ref 4.0–10.5)
nRBC: 0 % (ref 0.0–0.2)

## 2019-10-01 LAB — HEPARIN LEVEL (UNFRACTIONATED): Heparin Unfractionated: 0.34 IU/mL (ref 0.30–0.70)

## 2019-10-01 MED ORDER — APIXABAN 5 MG PO TABS
10.0000 mg | ORAL_TABLET | Freq: Two times a day (BID) | ORAL | Status: DC
  Administered 2019-10-01: 10 mg via ORAL
  Filled 2019-10-01: qty 2

## 2019-10-01 MED ORDER — APIXABAN 5 MG PO TABS: 5.0000 mg | ORAL_TABLET | Freq: Two times a day (BID) | ORAL | Status: DC

## 2019-10-01 MED ORDER — HYDROCODONE-ACETAMINOPHEN 5-325 MG PO TABS: 1.0000 | ORAL_TABLET | ORAL | Status: DC | PRN

## 2019-10-01 MED ORDER — APIXABAN (ELIQUIS) VTE STARTER PACK (10MG AND 5MG)
ORAL_TABLET | ORAL | 0 refills | Status: DC
Start: 2019-10-01 — End: 2019-10-13

## 2019-10-01 MED ORDER — NICOTINE 14 MG/24HR TD PT24: 14.0000 mg | MEDICATED_PATCH | Freq: Every day | TRANSDERMAL | 0 refills | Status: DC

## 2019-10-01 NOTE — TOC Progression Note (Signed)
Transition of Care [facility]) - Progression Note    Patient Details  Name: Chad Meyer MRN: [id number] Date of Birth: 1950-04-28  Transition of Care [facility] CM/SW Contact  Geni Bers, RN Phone Number: 10/01/2019, 9:00 AM  Clinical Narrative:     [facility]. Spoke with pt concerning Home Health needs and [place]. Pt states he will not need HH and he use [facility] [place].   Expected Discharge Plan: Home/Self Care Barriers to Discharge: No Barriers Identified  Expected Discharge Plan and Services Expected Discharge Plan: Home/Self Care       Living arrangements for the past 2 months: Single Family Home                                       Social Determinants of Health (SDOH) Interventions    Readmission Risk Interventions No flowsheet data found.

## 2019-10-01 NOTE — Progress Notes (Signed)
° °  Appears there was an error uploading the echocardiogram results from 09/30/19. Results reviewed with Dr. Tenny Craw as follows:  - LV EF 55-60%, G1DD, no RWMA - RV systolic function is normal, RV size is normal - No evidence of MR/MS - Aortic valve is tricuspid. Mild aortic valve sclerosis without evidence of aortic stenosis.   Beatriz Stallion, PA-C 10/01/19; 12:01 PM

## 2019-10-01 NOTE — Progress Notes (Signed)
Pt to be discharged to home this afternoon. Pt given discharge teaching including all Medications and schedules for these Medications reviewed with Pt. Pt verbalized understanding of all discharge teaching. Discharge packet with Prescription with Pt at time of discharge

## 2019-10-01 NOTE — Discharge Summary (Addendum)
Physician Discharge Summary  Chad Meyer HBZ:[id number] DOB: Jul 12, 1950 DOA: 09/29/2019  PCP: Patient, No Pcp Per  Admit date: 09/29/2019 Discharge date: 10/01/2019  Admitted From: Home Disposition: Home   Recommendations for Outpatient Follow-up:  1. Follow up with PCP at [facility] in 1-2 weeks. Given 30-day card as bridge to PCP prescribing eliquis for acute PE.  2. Continue smoking cessation counseling. Nicotine patches provided at discharge.  Home Health: None Equipment/Devices: None Discharge Condition: Stable CODE STATUS: Full Diet recommendation: Heart healthy  Brief/Interim Summary: Chad Meyer is a 69 y.o. male Veteran with a history of hypothyroidism, alcoholism, and osteoarthritis who presented to the ED 7/7 with pleuritic chest pain found to have pulmonary embolism with right heart strain (RV:LV = 1.1 by CTA), though not hypoxemic or hypotensive. Heparin infusion was started and he was admitted for monitoring. Echocardiogram demonstrated no evidence of right heart strain. He has remained hemodynamically stable without hypoxemia and tolerant of anticoagulation prior to discharge on 7/9.   Discharge Diagnoses:  Principal Problem:   Acute pulmonary embolism (HCC) Active Problems:   Hypothyroidism   Tobacco use   Alcohol use - There was no evidence of acute alcohol intoxication or withdrawal during admission. All home medications were continued.  Discharge Instructions Discharge Instructions    Diet - low sodium heart healthy   Complete by: As directed    Discharge instructions   Complete by: As directed    - Take eliquis (blood thinner) as directed twice daily with dose change from 10mg  to 5mg  after 7 days to treat the blood clots in your lungs. Return for care immediately if you develop shortness of breath, or fainting, or other severe symptoms. Otherwise, follow up with your primary doctor at the [facility] to continue the blood thinner and for routine hospital follow  up.  - It is good that you wish to stop smoking. Nicotine patches have been prescribed for you at discharge. Follow up regarding this with your PCP as well.   Increase activity slowly   Complete by: As directed      Allergies as of 10/01/2019   No Known Allergies     Medication List    TAKE these medications   Apixaban Starter Pack (10mg  and 5mg ) Commonly known as: ELIQUIS STARTER PACK Take as directed on package: start with two-5mg  tablets twice daily for 7 days. On day 8, switch to one-5mg  tablet twice daily. Notes to patient: Please follow dosing instructions for this Medication. Last Dose of this Medication was given on 10/01/2019 at 10:12 am   levothyroxine 125 MCG tablet Commonly known as: SYNTHROID Take 250 mcg by mouth daily before breakfast.   nicotine 14 mg/24hr patch Commonly known as: NICODERM CQ - dosed in mg/24 hours Place 1 patch (14 mg total) onto the skin daily. Start taking on: October 02, 2019 Notes to patient: Last Nicotine Patch was applied on 10/01/2019 at 10:13 am. Please remove the old Nicotine Patch when placing the new patch on.   sildenafil 100 MG tablet Commonly known as: VIAGRA Take 50 mg by mouth daily as needed for erectile dysfunction.   vitamin B-12 500 MCG tablet Commonly known as: CYANOCOBALAMIN Take 500 mcg by mouth daily.   Vitamin D3 50 MCG (2000 UT) Tabs Take 2,000 Units by mouth daily.       No Known Allergies  Consultations:  Cardiology  Procedures/Studies: DG Chest 2 View  Addendum Date: 09/29/2019   ADDENDUM REPORT: 09/29/2019 10:20 ADDENDUM: With a more focal opacity  in the RIGHT upper lobe infection is favored. Suggest follow-up to ensure resolution. These results were called by telephone at the time of interpretation on 09/29/2019 at 10:20 am to [facility] , who verbally acknowledged these results. Electronically Signed   By: Donzetta KohutGeoffrey  Wile M.D.   On: 09/29/2019 10:20   Result Date: 09/29/2019 CLINICAL DATA:  RIGHT  lateral chest pain soreness and shortness of breath with productive cough for 3-4 days EXAM: CHEST - 2 VIEW COMPARISON:  None FINDINGS: Trachea is midline. Cardiomediastinal contours are normal. Mild fullness of RIGHT and LEFT hila. Inter stool markings are increased bilaterally with hazy density in the RIGHT upper chest. No lobar consolidation. No sign of pleural effusion. Convex margin along the RIGHT heart border overlying the RIGHT hilum likely ectatic thoracic aorta. On limited assessment visualized skeletal structures without acute process. IMPRESSION: Increased interstitial markings suggested throughout without signs of edema or effusion. Hazy density in the RIGHT upper chest as described. Findings raise the question of background interstitial disease or though could be seen with atypical infection or asymmetric edema. Aortic ectasia. Electronically Signed: By: Donzetta KohutGeoffrey  Wile M.D. On: 09/29/2019 09:37   CT Angio Chest PE W and/or Wo Contrast  Result Date: 09/29/2019 CLINICAL DATA:  Shortness of breath and chest pain. EXAM: CT ANGIOGRAPHY CHEST WITH CONTRAST TECHNIQUE: Multidetector CT imaging of the chest was performed using the standard protocol during bolus administration of intravenous contrast. Multiplanar CT image reconstructions and MIPs were obtained to evaluate the vascular anatomy. CONTRAST:  100mL OMNIPAQUE IOHEXOL 350 MG/ML SOLN COMPARISON:  None. FINDINGS: Cardiovascular: The heart size is normal. No substantial pericardial effusion. Coronary artery calcification is evident. Atherosclerotic calcification is noted in the wall of the thoracic aorta. Occlusive pulmonary embolus identified in segmental pulmonary arteries to the posterior right upper lobe. Nonocclusive filling defect identified in the left lower lobe pulmonary artery extending into segmental and subsegmental branches of the left lower lobe. Segmental and subsegmental pulmonary embolus is identified in the right lower lobe as well.  Mediastinum/Nodes: No mediastinal lymphadenopathy. There is no hilar lymphadenopathy. The esophagus has normal imaging features. There is no axillary lymphadenopathy. Lungs/Pleura: Interstitial and patchy airspace disease is identified posterior right upper lobe compatible with infarct. Atelectasis noted in the lung bases. No pleural effusion. Upper Abdomen: Unremarkable. Musculoskeletal: No worrisome lytic or sclerotic osseous abnormality. Review of the MIP images confirms the above findings. IMPRESSION: 1. Occlusive pulmonary embolus identified in segmental branches to the posterior right upper lobe with associated posterior right upper lobe pulmonary infarct. Nonocclusive pulmonary embolus in the left lower lobe pulmonary artery extending into segmental and subsegmental branches of the left lower lobe. Segmental and subsegmental pulmonary embolus identified in the right lower lobe, as well. CTevidence of right heart strain (RV/LV Ratio = 1.1) consistent with at least submassive (intermediate risk) PE. The presence of right heart strain has been associated with an increased risk of morbidity and mortality. 2. Aortic Atherosclerosis (ICD10-I70.0). Critical Value/emergent results were called by me at the time of interpretation on 09/29/2019 at 7:03 pm to provider [facility] , who verbally acknowledged these results. Electronically Signed   By: Kennith CenterEric  Mansell M.D.   On: 09/29/2019 19:04   - LV EF 55-60%, G1DD, no RWMA - RV systolic function is normal, RV size is normal - No evidence of MR/MS - Aortic valve is tricuspid. Mild aortic valve sclerosis without evidence of aortic stenosis.   Subjective: Feels well, much better. No chest pain or shortness of breath  currently. Denies bleeding, leg swelling. Wants to go home ASAP.   Discharge Exam: Vitals:   10/01/19 0900 10/01/19 1248  BP: 120/62 131/69  Pulse: 82 93  Resp:    Temp:  100.1 F (37.8 C)  SpO2:  98%   General: Chad Meyer is alert, awake, not in acute  distress Cardiovascular: RRR, S1/S2 +, no rubs, no gallops Respiratory: CTA bilaterally, no wheezing, no rhonchi Abdominal: Soft, NT, ND, bowel sounds + Extremities: No edema, no cyanosis  Labs: BNP (last 3 results) No results for input(s): BNP in the last 8760 hours. Basic Metabolic Panel: Recent Labs  Lab 09/29/19 0907 09/29/19 1611 09/30/19 0431 10/01/19 0359  NA 136  --  136 136  K 3.8  --  3.3* 3.6  CL 99  --  100 101  CO2 24  --  26 27  GLUCOSE 88  --  111* 122*  BUN <5*  --  5* 6*  CREATININE 0.88  --  0.74 0.57*  CALCIUM 9.1  --  8.5* 8.7*  MG  --  2.1  --   --   PHOS  --  3.6  --   --    Liver Function Tests: No results for input(s): AST, ALT, ALKPHOS, BILITOT, PROT, ALBUMIN in the last 168 hours. No results for input(s): LIPASE, AMYLASE in the last 168 hours. No results for input(s): AMMONIA in the last 168 hours. CBC: Recent Labs  Lab 09/29/19 0907 09/30/19 0431 10/01/19 0359  WBC 8.0 7.8 10.0  HGB 16.5 14.7 14.3  HCT 50.3 43.3 42.7  MCV 94.9 93.3 94.3  PLT 233 203 198   Cardiac Enzymes: No results for input(s): CKTOTAL, CKMB, CKMBINDEX, TROPONINI in the last 168 hours. BNP: Invalid input(s): POCBNP CBG: No results for input(s): GLUCAP in the last 168 hours. D-Dimer Recent Labs    09/29/19 1622  DDIMER 4.61*   Hgb A1c No results for input(s): HGBA1C in the last 72 hours. Lipid Profile No results for input(s): CHOL, HDL, LDLCALC, TRIG, CHOLHDL, LDLDIRECT in the last 72 hours. Thyroid function studies No results for input(s): TSH, T4TOTAL, T3FREE, THYROIDAB in the last 72 hours.  Invalid input(s): FREET3 Anemia work up No results for input(s): VITAMINB12, FOLATE, FERRITIN, TIBC, IRON, RETICCTPCT in the last 72 hours. Urinalysis No results found for: COLORURINE, APPEARANCEUR, LABSPEC, PHURINE, GLUCOSEU, HGBUR, BILIRUBINUR, KETONESUR, PROTEINUR, UROBILINOGEN, NITRITE, LEUKOCYTESUR  Microbiology Recent Results (from the past 240 hour(s))   SARS Coronavirus 2 by RT PCR (hospital order, performed in [facility] hospital lab) Nasopharyngeal Nasopharyngeal Swab     Status: None   Collection Time: 09/29/19  4:18 PM   Specimen: Nasopharyngeal Swab  Result Value Ref Range Status   SARS Coronavirus 2 NEGATIVE NEGATIVE Final    Comment: (NOTE) SARS-CoV-2 target nucleic acids are NOT DETECTED.  The SARS-CoV-2 RNA is generally detectable in upper and lower respiratory specimens during the acute phase of infection. The lowest concentration of SARS-CoV-2 viral copies this assay can detect is 250 copies / mL. A negative result does not preclude SARS-CoV-2 infection and should not be used as the sole basis for treatment or other patient management decisions.  A negative result may occur with improper specimen collection / handling, submission of specimen other than nasopharyngeal swab, presence of viral mutation(s) within the areas targeted by this assay, and inadequate number of viral copies (<250 copies / mL). A negative result must be combined with clinical observations, patient history, and epidemiological information.  Fact Sheet for Patients:  BoilerBrush.com.cy  Fact Sheet for Healthcare Providers: https://pope.com/  This test is not yet approved or  cleared by the [place] FDA and has been authorized for detection and/or diagnosis of SARS-CoV-2 by FDA under an Emergency Use Authorization (EUA).  This EUA will remain in effect (meaning this test can be used) for the duration of the COVID-19 declaration under Section 564(b)(1) of the Act, 21 U.S.C. section 360bbb-3(b)(1), unless the authorization is terminated or revoked sooner.  Performed at [facility], [address] [place], [place] [address]     Time coordinating discharge: Approximately 40 minutes  Tyrone Nine, MD  [facility] 10/01/2019, 3:50 PM

## 2019-10-01 NOTE — Progress Notes (Signed)
ANTICOAGULATION CONSULT NOTE  Pharmacy Consult for IV heparin --> PO apixaban  Indication: pulmonary embolus  No Known Allergies  Patient Measurements: Height: 6' (182.9 cm) Weight: 70.3 kg (155 lb) IBW/kg (Calculated) : 77.6 Heparin Dosing Weight: actual body weight   Vital Signs: Temp: 99.2 F (37.3 C) (07/09 0543) Temp Source: Oral (07/09 0543) BP: 116/67 (07/09 0543) Pulse Rate: 91 (07/09 0543)  Labs: Recent Labs     0000 09/29/19 0907 09/29/19 1600 09/30/19 0200 09/30/19 0431 09/30/19 1115 09/30/19 1713 10/01/19 0359  HGB   < > 16.5  --   --  14.7  --   --  14.3  HCT  --  50.3  --   --  43.3  --   --  42.7  PLT  --  233  --   --  203  --   --  198  LABPROT  --   --   --   --  13.7  --   --   --   INR  --   --   --   --  1.1  --   --   --   HEPARINUNFRC  --   --   --    < >  --  0.51 0.37 0.34  CREATININE  --  0.88  --   --  0.74  --   --  0.57*  TROPONINIHS  --  5 3  --   --   --   --   --    < > = values in this interval not displayed.    Estimated Creatinine Clearance: 87.9 mL/min (A) (by C-G formula based on SCr of 0.57 mg/dL (L)).   Assessment: 59 y/oM who presents with shortness of breath. CTa chest identified occlusive right sided PE and non-occlusive L sided PE with evidence of heart strain. Pharmacy consulted for IV heparin dosing on admission. Patient not on anticoagulants PTA. Now asked to transition to PO Apixaban.   Today, 10/01/19:   AM heparin level = 0.34 units/mL, remains therapeutic  CBC: H/H, Pltc WNL  No bleeding or infusion issues reported per nursing  SCr 0.57  Plan:   Stop heparin infusion at same time first dose of Apixaban given.  Apixaban 10mg  PO BID x 7 days, then 5mg  PO BID thereafter.  Monitor renal function, CBC, and for s/sx of bleeding.  Pharmacy to provide education and 30-day free coupon prior to discharge.    , PharmD, BCPS Clinical Pharmacist  10/01/2019 10:15 AM

## 2019-10-01 NOTE — Discharge Instructions (Signed)
Information on my medicine - ELIQUIS (apixaban)  This medication education was reviewed with me or my healthcare representative as part of my discharge preparation.  The pharmacist that spoke with me during my hospital stay was:  Hector Brunswick, Student-PharmD  Why was Eliquis prescribed for you? Eliquis was prescribed to treat blood clots that may have been found in the veins of your legs (deep vein thrombosis) or in your lungs (pulmonary embolism) and to reduce the risk of them occurring again.  What do You need to know about Eliquis ? The starting dose is 10 mg (two 5 mg tablets) taken TWICE daily for the FIRST SEVEN (7) DAYS, then on (enter date)  10/08/2019  the dose is reduced to ONE 5 mg tablet taken TWICE daily.  Eliquis may be taken with or without food.   Try to take the dose about the same time in the morning and in the evening. If you have difficulty swallowing the tablet whole please discuss with your pharmacist how to take the medication safely.  Take Eliquis exactly as prescribed and DO NOT stop taking Eliquis without talking to the doctor who prescribed the medication.  Stopping may increase your risk of developing a new blood clot.  Refill your prescription before you run out.  After discharge, you should have regular check-up appointments with your healthcare provider that is prescribing your Eliquis.    What do you do if you miss a dose? If a dose of ELIQUIS is not taken at the scheduled time, take it as soon as possible on the same day and twice-daily administration should be resumed. The dose should not be doubled to make up for a missed dose.  Important Safety Information A possible side effect of Eliquis is bleeding. You should call your healthcare provider right away if you experience any of the following: ? Bleeding from an injury or your nose that does not stop. ? Unusual colored urine (red or dark brown) or unusual colored stools (red or black). ? Unusual  bruising for unknown reasons. ? A serious fall or if you hit your head (even if there is no bleeding).  Some medicines may interact with Eliquis and might increase your risk of bleeding or clotting while on Eliquis. To help avoid this, consult your healthcare provider or pharmacist prior to using any new prescription or non-prescription medications, including herbals, vitamins, non-steroidal anti-inflammatory drugs (NSAIDs) and supplements.  This website has more information on Eliquis (apixaban): http://www.eliquis.com/eliquis/home

## 2019-10-04 LAB — ECHOCARDIOGRAM COMPLETE
Height: 72 in
Weight: 2480 oz

## 2019-10-09 ENCOUNTER — Emergency Department (HOSPITAL_COMMUNITY): Payer: No Typology Code available for payment source

## 2019-10-09 ENCOUNTER — Encounter (HOSPITAL_COMMUNITY): Payer: Self-pay | Admitting: Emergency Medicine

## 2019-10-09 ENCOUNTER — Inpatient Hospital Stay (HOSPITAL_COMMUNITY): Payer: No Typology Code available for payment source

## 2019-10-09 ENCOUNTER — Inpatient Hospital Stay (HOSPITAL_COMMUNITY)
Admission: EM | Admit: 2019-10-09 | Discharge: 2019-10-13 | DRG: 178 | Disposition: A | Discharge status: Home / Self Care | Payer: No Typology Code available for payment source | Attending: Internal Medicine | Admitting: Internal Medicine

## 2019-10-09 ENCOUNTER — Other Ambulatory Visit: Payer: Self-pay

## 2019-10-09 DIAGNOSIS — I472 Ventricular tachycardia: Secondary | ICD-10-CM | POA: Diagnosis present

## 2019-10-09 DIAGNOSIS — Z79890 Hormone replacement therapy (postmenopausal): Secondary | ICD-10-CM | POA: Diagnosis not present

## 2019-10-09 DIAGNOSIS — I2699 Other pulmonary embolism without acute cor pulmonale: Secondary | ICD-10-CM | POA: Diagnosis not present

## 2019-10-09 DIAGNOSIS — E785 Hyperlipidemia, unspecified: Secondary | ICD-10-CM | POA: Diagnosis present

## 2019-10-09 DIAGNOSIS — K921 Melena: Secondary | ICD-10-CM | POA: Diagnosis present

## 2019-10-09 DIAGNOSIS — R042 Hemoptysis: Secondary | ICD-10-CM | POA: Diagnosis present

## 2019-10-09 DIAGNOSIS — E039 Hypothyroidism, unspecified: Secondary | ICD-10-CM | POA: Diagnosis present

## 2019-10-09 DIAGNOSIS — Z86711 Personal history of pulmonary embolism: Secondary | ICD-10-CM

## 2019-10-09 DIAGNOSIS — Z79899 Other long term (current) drug therapy: Secondary | ICD-10-CM

## 2019-10-09 DIAGNOSIS — J850 Gangrene and necrosis of lung: Secondary | ICD-10-CM | POA: Diagnosis not present

## 2019-10-09 DIAGNOSIS — Z7901 Long term (current) use of anticoagulants: Secondary | ICD-10-CM

## 2019-10-09 DIAGNOSIS — K7460 Unspecified cirrhosis of liver: Secondary | ICD-10-CM

## 2019-10-09 DIAGNOSIS — E8809 Other disorders of plasma-protein metabolism, not elsewhere classified: Secondary | ICD-10-CM | POA: Diagnosis present

## 2019-10-09 DIAGNOSIS — F17290 Nicotine dependence, other tobacco product, uncomplicated: Secondary | ICD-10-CM | POA: Diagnosis present

## 2019-10-09 DIAGNOSIS — J984 Other disorders of lung: Secondary | ICD-10-CM

## 2019-10-09 LAB — COMPREHENSIVE METABOLIC PANEL
ALT: 82 U/L — ABNORMAL HIGH (ref 0–44)
AST: 63 U/L — ABNORMAL HIGH (ref 15–41)
Albumin: 2.4 g/dL — ABNORMAL LOW (ref 3.5–5.0)
Alkaline Phosphatase: 77 U/L (ref 38–126)
Anion gap: 13 (ref 5–15)
BUN: 15 mg/dL (ref 8–23)
CO2: 27 mmol/L (ref 22–32)
Calcium: 9.1 mg/dL (ref 8.9–10.3)
Chloride: 93 mmol/L — ABNORMAL LOW (ref 98–111)
Creatinine, Ser: 0.99 mg/dL (ref 0.61–1.24)
GFR calc Af Amer: >60 mL/min (ref >60)
GFR calc non Af Amer: >60 mL/min (ref >60)
Glucose, Bld: 143 mg/dL — ABNORMAL HIGH (ref 70–99)
Potassium: 3.9 mmol/L (ref 3.5–5.1)
Sodium: 133 mmol/L — ABNORMAL LOW (ref 135–145)
Total Bilirubin: 0.5 mg/dL (ref 0.3–1.2)
Total Protein: 7.6 g/dL (ref 6.5–8.1)

## 2019-10-09 LAB — CBC WITH DIFFERENTIAL/PLATELET
Abs Immature Granulocytes: 0.07 10*3/uL (ref 0.00–0.07)
Basophils Absolute: 0.1 10*3/uL (ref 0.0–0.1)
Basophils Relative: 0 %
Eosinophils Absolute: 0.1 10*3/uL (ref 0.0–0.5)
Eosinophils Relative: 0 %
HCT: 40.8 % (ref 39.0–52.0)
Hemoglobin: 13.7 g/dL (ref 13.0–17.0)
Immature Granulocytes: 1 %
Lymphocytes Relative: 5 %
Lymphs Abs: 0.6 10*3/uL — ABNORMAL LOW (ref 0.7–4.0)
MCH: 31.1 pg (ref 26.0–34.0)
MCHC: 33.6 g/dL (ref 30.0–36.0)
MCV: 92.5 fL (ref 80.0–100.0)
Monocytes Absolute: 1 10*3/uL (ref 0.1–1.0)
Monocytes Relative: 8 %
Neutro Abs: 10.3 10*3/uL — ABNORMAL HIGH (ref 1.7–7.7)
Neutrophils Relative %: 86 %
Platelets: 420 10*3/uL — ABNORMAL HIGH (ref 150–400)
RBC: 4.41 MIL/uL (ref 4.22–5.81)
RDW: 15.3 % (ref 11.5–15.5)
WBC: 12 10*3/uL — ABNORMAL HIGH (ref 4.0–10.5)
nRBC: 0 % (ref 0.0–0.2)

## 2019-10-09 LAB — PROTIME-INR
INR: 1.5 — ABNORMAL HIGH (ref 0.8–1.2)
Prothrombin Time: 17.5 seconds — ABNORMAL HIGH (ref 11.4–15.2)

## 2019-10-09 LAB — HIV ANTIBODY (ROUTINE TESTING W REFLEX): HIV Screen 4th Generation wRfx: NONREACTIVE

## 2019-10-09 LAB — PROCALCITONIN: Procalcitonin: 0.12 ng/mL

## 2019-10-09 LAB — MAGNESIUM: Magnesium: 2.1 mg/dL (ref 1.7–2.4)

## 2019-10-09 LAB — ETHANOL: Alcohol, Ethyl (B): <10 mg/dL (ref <10)

## 2019-10-09 LAB — PHOSPHORUS: Phosphorus: 2.9 mg/dL (ref 2.5–4.6)

## 2019-10-09 LAB — APTT: aPTT: 31 seconds (ref 24–36)

## 2019-10-09 MED ORDER — ACETAMINOPHEN 325 MG PO TABS
650.0000 mg | ORAL_TABLET | Freq: Four times a day (QID) | ORAL | Status: DC | PRN
  Administered 2019-10-10 – 2019-10-13 (×4): 650 mg via ORAL
  Filled 2019-10-09 (×4): qty 2

## 2019-10-09 MED ORDER — HEPARIN BOLUS VIA INFUSION
2000.0000 [IU] | Freq: Once | INTRAVENOUS | Status: AC
  Administered 2019-10-09: 2000 [IU] via INTRAVENOUS
  Filled 2019-10-09: qty 2000

## 2019-10-09 MED ORDER — METOPROLOL TARTRATE 12.5 MG HALF TABLET
12.5000 mg | ORAL_TABLET | Freq: Two times a day (BID) | ORAL | Status: DC
  Administered 2019-10-09 – 2019-10-13 (×8): 12.5 mg via ORAL
  Filled 2019-10-09 (×8): qty 1

## 2019-10-09 MED ORDER — GUAIFENESIN ER 600 MG PO TB12
1200.0000 mg | ORAL_TABLET | Freq: Two times a day (BID) | ORAL | Status: DC
  Administered 2019-10-09 – 2019-10-13 (×9): 1200 mg via ORAL
  Filled 2019-10-09 (×10): qty 2

## 2019-10-09 MED ORDER — NICOTINE 14 MG/24HR TD PT24
14.0000 mg | MEDICATED_PATCH | Freq: Every day | TRANSDERMAL | Status: DC
  Filled 2019-10-09 (×2): qty 1

## 2019-10-09 MED ORDER — VITAMIN D 25 MCG (1000 UNIT) PO TABS
2000.0000 [IU] | ORAL_TABLET | Freq: Every day | ORAL | Status: DC
  Administered 2019-10-10 – 2019-10-13 (×4): 2000 [IU] via ORAL
  Filled 2019-10-09 (×4): qty 2

## 2019-10-09 MED ORDER — HEPARIN (PORCINE) 25000 UT/250ML-% IV SOLN
2150.0000 [IU]/h | INTRAVENOUS | Status: DC
  Administered 2019-10-09: 1050 [IU]/h via INTRAVENOUS
  Administered 2019-10-10: 1550 [IU]/h via INTRAVENOUS
  Administered 2019-10-11: 1950 [IU]/h via INTRAVENOUS
  Filled 2019-10-09 (×5): qty 250

## 2019-10-09 MED ORDER — IOHEXOL 350 MG/ML SOLN
75.0000 mL | Freq: Once | INTRAVENOUS | Status: AC | PRN
  Administered 2019-10-09: 75 mL via INTRAVENOUS

## 2019-10-09 MED ORDER — VITAMIN B-12 1000 MCG PO TABS
500.0000 ug | ORAL_TABLET | Freq: Every day | ORAL | Status: DC
  Administered 2019-10-10 – 2019-10-13 (×4): 500 ug via ORAL
  Filled 2019-10-09 (×4): qty 1

## 2019-10-09 MED ORDER — LEVOTHYROXINE SODIUM 50 MCG PO TABS
250.0000 ug | ORAL_TABLET | Freq: Every day | ORAL | Status: DC
  Administered 2019-10-10 – 2019-10-13 (×4): 250 ug via ORAL
  Filled 2019-10-09 (×4): qty 2

## 2019-10-09 MED ORDER — ACETAMINOPHEN 650 MG RE SUPP: 650.0000 mg | Freq: Four times a day (QID) | RECTAL | Status: DC | PRN

## 2019-10-09 NOTE — H&P (Signed)
History and Physical    Chad Palierry Huot AVW:[id number] DOB: 1950/10/16 DOA: 10/09/2019  PCP: Patient, No Pcp Per (Confirm with patient/family/NH records and if not entered, [facility]) Patient coming from: home  I have personally briefly reviewed patient's [facility]  Chief Complaint: Coughing up blood  HPI: Chad Meyer is a 69 y.o. male with medical history significant of recently diagnosed PE with right heart strain, hypothyroidism, alcohol abuse and tobacco abuse, presented with coughing up blood.  Patient was recently diagnosed with PE after right-sided lung on July 7, echo showed right heart strain with RV:LV=1:1 (CTA), patient's hypoxia and other symptoms resolved and sent home with Eliquis.  Since then, patient has had occasionally coughing up blood clots, small amount intermittent, denies any chest pain or shortness of breath no fever chills until about 2 days ago, when he started coughing up clots more frequent and increasing amount of blood clots, and he has had night sweats but no fever or chills.  Last night to this morning he coughed up about 1 cup / 12 ounce of blood clots, again denies any chest pain no short of breath.  He came to ER today for evaluation. ED Course: CT angiogram showed right upper lung necrosis with blood clot burden improvement of the right side lung.  No hypoxia or tachycardia, WBC 12, hemoglobin 13.7.  Review of Systems: As per HPI otherwise 10 point review of systems negative.    Past Medical History:  Diagnosis Date  . Arthritis   . Hypothyroidism   . Thyroid disease     Past Surgical History:  Procedure Laterality Date  . NO PAST SURGERIES       reports that he has been smoking cigars. He has never used smokeless tobacco. He reports current alcohol use. He reports that he does not use drugs.  No Known Allergies  Family History  Problem Relation Age of Onset  . Hypertension Mother    . Hypertension Father   . CAD Father      Prior to Admission medications   Medication Sig Start Date End Date Taking? Authorizing Provider  APIXABAN Everlene Balls(ELIQUIS) VTE STARTER PACK (10MG  AND 5MG ) Take as directed on package: start with two-5mg  tablets twice daily for 7 days. On day 8, switch to one-5mg  tablet twice daily. 10/01/19   Tyrone NineGrunz, Ryan B, MD  Cholecalciferol (VITAMIN D3) 50 MCG (2000 UT) TABS Take 2,000 Units by mouth daily.    [provider]  levothyroxine (SYNTHROID) 125 MCG tablet Take 250 mcg by mouth daily before breakfast.    [provider]  nicotine (NICODERM CQ - DOSED IN MG/24 HOURS) 14 mg/24hr patch Place 1 patch (14 mg total) onto the skin daily. 10/02/19   Tyrone NineGrunz, Ryan B, MD  sildenafil (VIAGRA) 100 MG tablet Take 50 mg by mouth daily as needed for erectile dysfunction.    [provider]  vitamin B-12 (CYANOCOBALAMIN) 500 MCG tablet Take 500 mcg by mouth daily.    [provider]    Physical Exam: Vitals:   10/09/19 1025 10/09/19 1315 10/09/19 1417 10/09/19 1418  BP: 131/85 128/78    Pulse: (!) 103 82 82 81  Resp: 16 (!) 30 18 18   Temp: 97.9 F (36.6 C)     TempSrc: Oral     SpO2: 100% 96% 98% 97%  Weight:      Height:        Constitutional: NAD, calm,  comfortable Vitals:   10/09/19 1025 10/09/19 1315 10/09/19 1417 10/09/19 1418  BP: 131/85 128/78    Pulse: (!) 103 82 82 81  Resp: 16 (!) 30 18 18   Temp: 97.9 F (36.6 C)     TempSrc: Oral     SpO2: 100% 96% 98% 97%  Weight:      Height:       Eyes: PERRL, lids and conjunctivae normal ENMT: Mucous membranes are moist. Posterior pharynx clear of any exudate or lesions.Normal dentition.  Neck: normal, supple, no masses, no thyromegaly Respiratory: clear to auscultation bilaterally, no wheezing, no crackles. Normal respiratory effort. No accessory muscle use.  Cardiovascular: Regular rate and rhythm, no murmurs / rubs / gallops. No extremity edema. 2+ pedal pulses. No  carotid bruits.  Abdomen: no tenderness, no masses palpated. No hepatosplenomegaly. Bowel sounds positive.  Musculoskeletal: no clubbing / cyanosis. No joint deformity upper and lower extremities. Good ROM, no contractures. Normal muscle tone.  Skin: no rashes, lesions, ulcers. No induration Neurologic: CN 2-12 grossly intact. Sensation intact, DTR normal. Strength 5/5 in all 4.  Psychiatric: Normal judgment and insight. Alert and oriented x 3. Normal mood.     Labs on Admission: I have personally reviewed following labs and imaging studies  CBC: Recent Labs  Lab 10/09/19 1046  WBC 12.0*  NEUTROABS 10.3*  HGB 13.7  HCT 40.8  MCV 92.5  PLT 420*   Basic Metabolic Panel: Recent Labs  Lab 10/09/19 1046  NA 133*  K 3.9  CL 93*  CO2 27  GLUCOSE 143*  BUN 15  CREATININE 0.99  CALCIUM 9.1   GFR: Estimated Creatinine Clearance: 66.5 mL/min (by C-G formula based on SCr of 0.99 mg/dL). Liver Function Tests: Recent Labs  Lab 10/09/19 1046  AST 63*  ALT 82*  ALKPHOS 77  BILITOT 0.5  PROT 7.6  ALBUMIN 2.4*   No results for input(s): LIPASE, AMYLASE in the last 168 hours. No results for input(s): AMMONIA in the last 168 hours. Coagulation Profile: Recent Labs  Lab 10/09/19 1140  INR 1.5*   Cardiac Enzymes: No results for input(s): CKTOTAL, CKMB, CKMBINDEX, TROPONINI in the last 168 hours. BNP (last 3 results) No results for input(s): PROBNP in the last 8760 hours. HbA1C: No results for input(s): HGBA1C in the last 72 hours. CBG: No results for input(s): GLUCAP in the last 168 hours. Lipid Profile: No results for input(s): CHOL, HDL, LDLCALC, TRIG, CHOLHDL, LDLDIRECT in the last 72 hours. Thyroid Function Tests: No results for input(s): TSH, T4TOTAL, FREET4, T3FREE, THYROIDAB in the last 72 hours. Anemia Panel: No results for input(s): VITAMINB12, FOLATE, FERRITIN, TIBC, IRON, RETICCTPCT in the last 72 hours. Urine analysis: No results found for: COLORURINE,  APPEARANCEUR, LABSPEC, PHURINE, GLUCOSEU, HGBUR, BILIRUBINUR, KETONESUR, PROTEINUR, UROBILINOGEN, NITRITE, LEUKOCYTESUR  Radiological Exams on Admission: CT Angio Chest PE W/Cm &/Or Wo Cm  Result Date: 10/09/2019 CLINICAL DATA:  69 year old with history of pulmonary embolism. Shortness of breath. Hemoptysis. EXAM: CT ANGIOGRAPHY CHEST WITH CONTRAST TECHNIQUE: Multidetector CT imaging of the chest was performed using the standard protocol during bolus administration of intravenous contrast. Multiplanar CT image reconstructions and MIPs were obtained to evaluate the vascular anatomy. CONTRAST:  34mL OMNIPAQUE IOHEXOL 350 MG/ML SOLN COMPARISON:  09/29/2019 FINDINGS: Cardiovascular: Again noted are right-sided pulmonary emboli. There is thrombus in a segmental branch of the right upper lobe on sequence 7, image 108. There is partial recanalization of this vessel compared to the previous examination. Decreased clot burden in the right  lower lobe pulmonary arteries. There is still a small amount of submental pulmonary emboli in the right lower lobe. Markedly decreased clot burden in the left lower lobe segmental branches. Evidence for minimal residual clot in the left lower lobe. Main pulmonary arteries are widely patent. Heart size is stable. No significant pericardial fluid. Atherosclerotic calcifications involving the abdominal aorta. Coronary artery calcifications. Proximal descending thoracic aorta measures 3.2 cm and stable. Mediastinum/Nodes: Mediastinal structures are unremarkable without significant lymph node enlargement. No significant thyroid tissue is appreciated. Lungs/Pleura: Trachea and mainstem bronchi are patent. Parenchymal disease in the posterior right upper lobe corresponds with the large segmental pulmonary embolism in the right upper lobe branch. The consolidation in the posterior right upper lobe has progressed and now there is a large amount of gas within this area of consolidation. Findings  are most compatible with pulmonary necrosis secondary to a pulmonary infarct. Hazy patchy opacities in the right lower lobe particularly in the superior segment of the right lower lobe. Disease in the right lower lobe may have slightly progressed. No significant airspace disease or consolidation in the left lung. No large pleural effusions. Upper Abdomen: Visualized upper abdominal structures are within normal limits. Musculoskeletal: No acute bone abnormality. Review of the MIP images confirms the above findings. IMPRESSION: 1. Progressive consolidation in the posterior right upper lobe with evidence of necrosis. This area of necrosis and pulmonary infarct corresponds with a large segmental pulmonary embolism from the right upper lobe pulmonary artery. Pulmonary necrosis likely accounts for the patient's hemoptysis. 2. Pulmonary embolism clot burden has markedly decreased. Minimal clot remaining in the left lung. There is residual clot in the posterior right upper lobe corresponding with the necrotic/cavitary lung disease. 3. Slightly increased parenchymal disease in the right lower lobe is nonspecific but could represent a developing infectious or inflammatory process. Electronically Signed   By: Richarda Overlie M.D.   On: 10/09/2019 14:28   DG Chest Port 1 View  Result Date: 10/09/2019 CLINICAL DATA:  Chest pain and shortness of breath. Hemoptysis. Patient diagnosed with pulmonary emboli 09/29/2019. EXAM: PORTABLE CHEST 1 VIEW COMPARISON:  CT chest and single view of the chest scratch the CT chest and PA and lateral chest 09/29/2019. FINDINGS: Hazy airspace opacity in the right upper lung zone persists. There appears to be new cavitation present. The left lung is clear. No pneumothorax or pleural effusion. Heart size is normal. Atherosclerosis noted. IMPRESSION: Persistent hazy opacity in the right upper lobe with likely new cavitation at the site of pulmonary infarct on prior CT. Electronically Signed   By:  Drusilla Kanner M.D.   On: 10/09/2019 12:10    EKG: Ordered.  Assessment/Plan Active Problems:   Hematochezia  (please populate well all problems here in Problem List. (For example, if patient is on BP meds at home and you resume or decide to hold them, it is a problem that needs to be her. Same for CAD, COPD, HLD and so on)  Hematochezia -Likely from pulmonary necrosis of the right upper lung which showed up on the repeat CAT scan today.  Pulmonology informed, will evaluate patient later today, who recommend switch Eliquis to heparin drip.  Leukocytosis -Probably related to narcotic tissue absorption -No clear aerobronchogram on CT, check procalcitonin, hold off antibiotics for now  Hypoalbuminemia -Given the underlying Hx of EtOH abuse, suspect cirrhosis, check liver U/S  Tobacco abuse -Continue Nicotine patch.  DVT prophylaxis: Heparin drip for now Code Status: Full Family Communication: None at bediside Disposition Plan:  Likely need 1-2 days hospital stay for close monitoring of H/H and oxygen level before switching back to Eliquis Consults called: Pulm Admission status: Tele admit   Emeline General MD [facility] Pager [phone]  10/09/2019, 3:13 PM

## 2019-10-09 NOTE — Progress Notes (Signed)
Nursed called and reported 6 beats of non-sustained Vtach. BMP this afternoon showed K 3.9, will check Mg and Phos. SBP in 120s, no hypoxia. Continue tele monitoring.

## 2019-10-09 NOTE — Medical Student Note (Signed)
[facility]-EMERGENCY DEPT Provider Student Note For educational purposes for Medical, PA and NP students only and not part of the legal medical record.   CSN: [id number] Arrival date & time: 10/09/19  1017      History   Chief Complaint Chief Complaint  Patient presents with  . Hemoptysis    coughing up blood    HPI Chad Palierry Meyer is a 69 y.o. male presenting with hemoptysis x 10 days. The [facility]'s on 7/7 and was discharged with PO eliquis on 7/9. He reports his hemoptysis has gotten worse over the last few days but he cannot describe why- he denies increase in amount or thickness of blood in sputum. He reports coughing up blood a few times per hour, and not every time he coughs. He denies chest pain, palpitations, shortness of breath. He denies history of COPD or asthma. He denies recent travel by plane, trauma, surgery, illness in his family, and he reports that he is able to walk without assistive devices but is minimally active. He reports smoking 2 Black and Milds per day and drinking 2-4 alcoholic beverages per day.  He reports taking synthroid and vitamins.   HPI  Past Medical History:  Diagnosis Date  . Arthritis   . Hypothyroidism   . Thyroid disease     Patient Active Problem List   Diagnosis Date Noted  . Hematochezia 10/09/2019  . Acute pulmonary embolism (HCC) 09/29/2019  . Hypothyroidism 09/29/2019  . Tobacco use 09/29/2019  . Alcohol use 09/29/2019    Past Surgical History:  Procedure Laterality Date  . NO PAST SURGERIES         Home Medications    Prior to Admission medications   Medication Sig Start Date End Date Taking? Authorizing Provider  APIXABAN (ELIQUIS) VTE STARTER PACK (10MG  AND 5MG ) Take as directed on package: start with two-5mg  tablets twice daily for 7 days. On day 8, switch to one-5mg  tablet twice daily. Patient taking differently: Take 5-10 mg by mouth See admin instructions. Take as directed  on package: start with two-5mg  tablets twice daily for 7 days. On day 8, switch to one-5mg  tablet twice daily. 10/01/19  Yes Tyrone NineGrunz, Ryan B, MD  aspirin EC 81 MG tablet Take 324 mg by mouth 2 (two) times daily as needed (headache/pain). Swallow whole.   Yes [provider]  Cholecalciferol (VITAMIN D3) 50 MCG (2000 UT) TABS Take 2,000 Units by mouth daily.   Yes [provider]  levothyroxine (SYNTHROID) 125 MCG tablet Take 250 mcg by mouth daily before breakfast.   Yes [provider]  sildenafil (VIAGRA) 100 MG tablet Take 50 mg by mouth daily as needed for erectile dysfunction.   Yes [provider]  vitamin B-12 (CYANOCOBALAMIN) 500 MCG tablet Take 500 mcg by mouth daily.   Yes [provider]  nicotine (NICODERM CQ - DOSED IN MG/24 HOURS) 14 mg/24hr patch Place 1 patch (14 mg total) onto the skin daily. Patient not taking: Reported on 10/09/2019 10/02/19   Tyrone NineGrunz, Ryan B, MD    Family History Family History  Problem Relation Age of Onset  . Hypertension Mother   . Hypertension Father   . CAD Father     Social History Social History   Tobacco Use  . Smoking status: Current Every Day Smoker    Types: Cigars  . Smokeless tobacco: Never Used  . Tobacco comment: smokes 3-4 cigars a day  Vaping Use  .  Vaping Use: Never used  Substance Use Topics  . Alcohol use: Yes    Comment: 64-72 ounces beer most days   . Drug use: Never     Allergies   Patient has no known allergies.   Review of Systems Review of Systems  Constitutional: Negative for fatigue.  HENT: Positive for sore throat (reports sore throat ) and trouble swallowing (reports pain with swallowing).   Respiratory: Positive for cough. Negative for shortness of breath and wheezing.   Cardiovascular: Negative for chest pain, palpitations and leg swelling.  Gastrointestinal: Negative for abdominal pain, constipation and diarrhea.  Musculoskeletal: Negative for gait problem.  Skin:  Negative for pallor and rash.     Physical Exam Updated Vital Signs BP 128/78   Pulse 81   Temp 97.9 F (36.6 C) (Oral)   Resp 18   Ht 6' (1.829 m)   Wt 65.8 kg   SpO2 97%   BMI 19.67 kg/m   Physical Exam Constitutional:      General: He is not in acute distress.    Appearance: Normal appearance. He is not ill-appearing.  HENT:     Head: Normocephalic and atraumatic.  Eyes:     Extraocular Movements: Extraocular movements intact.  Cardiovascular:     Rate and Rhythm: Normal rate and regular rhythm.     Pulses: Normal pulses.  Pulmonary:     Effort: Pulmonary effort is normal. No respiratory distress.     Breath sounds: Normal breath sounds. No stridor. No wheezing, rhonchi or rales.  Chest:     Chest wall: No tenderness.  Abdominal:     General: Abdomen is flat.     Palpations: Abdomen is soft.  Musculoskeletal:        General: No swelling or tenderness. Normal range of motion.     Cervical back: Normal range of motion and neck supple.     Right lower leg: No edema.     Left lower leg: No edema.  Skin:    General: Skin is warm and dry.  Neurological:     Mental Status: He is alert.      ED Treatments / Results  Labs (all labs ordered are listed, but only abnormal results are displayed) Labs Reviewed  CBC WITH DIFFERENTIAL/PLATELET - Abnormal; Notable for the following components:      Result Value   WBC 12.0 (*)    Platelets 420 (*)    Neutro Abs 10.3 (*)    Lymphs Abs 0.6 (*)    All other components within normal limits  COMPREHENSIVE METABOLIC PANEL - Abnormal; Notable for the following components:   Sodium 133 (*)    Chloride 93 (*)    Glucose, Bld 143 (*)    Albumin 2.4 (*)    AST 63 (*)    ALT 82 (*)    All other components within normal limits  PROTIME-INR - Abnormal; Notable for the following components:   Prothrombin Time 17.5 (*)    INR 1.5 (*)    All other components within normal limits  ETHANOL  HIV ANTIBODY (ROUTINE TESTING W  REFLEX)  PROCALCITONIN  APTT    EKG  Radiology CT Angio Chest PE W/Cm &/Or Wo Cm  Result Date: 10/09/2019 CLINICAL DATA:  69 year old with history of pulmonary embolism. Shortness of breath. Hemoptysis. EXAM: CT ANGIOGRAPHY CHEST WITH CONTRAST TECHNIQUE: Multidetector CT imaging of the chest was performed using the standard protocol during bolus administration of intravenous contrast. Multiplanar CT image reconstructions and MIPs  were obtained to evaluate the vascular anatomy. CONTRAST:  27mL OMNIPAQUE IOHEXOL 350 MG/ML SOLN COMPARISON:  09/29/2019 FINDINGS: Cardiovascular: Again noted are right-sided pulmonary emboli. There is thrombus in a segmental branch of the right upper lobe on sequence 7, image 108. There is partial recanalization of this vessel compared to the previous examination. Decreased clot burden in the right lower lobe pulmonary arteries. There is still a small amount of submental pulmonary emboli in the right lower lobe. Markedly decreased clot burden in the left lower lobe segmental branches. Evidence for minimal residual clot in the left lower lobe. Main pulmonary arteries are widely patent. Heart size is stable. No significant pericardial fluid. Atherosclerotic calcifications involving the abdominal aorta. Coronary artery calcifications. Proximal descending thoracic aorta measures 3.2 cm and stable. Mediastinum/Nodes: Mediastinal structures are unremarkable without significant lymph node enlargement. No significant thyroid tissue is appreciated. Lungs/Pleura: Trachea and mainstem bronchi are patent. Parenchymal disease in the posterior right upper lobe corresponds with the large segmental pulmonary embolism in the right upper lobe branch. The consolidation in the posterior right upper lobe has progressed and now there is a large amount of gas within this area of consolidation. Findings are most compatible with pulmonary necrosis secondary to a pulmonary infarct. Hazy patchy opacities  in the right lower lobe particularly in the superior segment of the right lower lobe. Disease in the right lower lobe may have slightly progressed. No significant airspace disease or consolidation in the left lung. No large pleural effusions. Upper Abdomen: Visualized upper abdominal structures are within normal limits. Musculoskeletal: No acute bone abnormality. Review of the MIP images confirms the above findings. IMPRESSION: 1. Progressive consolidation in the posterior right upper lobe with evidence of necrosis. This area of necrosis and pulmonary infarct corresponds with a large segmental pulmonary embolism from the right upper lobe pulmonary artery. Pulmonary necrosis likely accounts for the patient's hemoptysis. 2. Pulmonary embolism clot burden has markedly decreased. Minimal clot remaining in the left lung. There is residual clot in the posterior right upper lobe corresponding with the necrotic/cavitary lung disease. 3. Slightly increased parenchymal disease in the right lower lobe is nonspecific but could represent a developing infectious or inflammatory process. Electronically Signed   By: Richarda Overlie M.D.   On: 10/09/2019 14:28   DG Chest Port 1 View  Result Date: 10/09/2019 CLINICAL DATA:  Chest pain and shortness of breath. Hemoptysis. Patient diagnosed with pulmonary emboli 09/29/2019. EXAM: PORTABLE CHEST 1 VIEW COMPARISON:  CT chest and single view of the chest scratch the CT chest and PA and lateral chest 09/29/2019. FINDINGS: Hazy airspace opacity in the right upper lung zone persists. There appears to be new cavitation present. The left lung is clear. No pneumothorax or pleural effusion. Heart size is normal. Atherosclerosis noted. IMPRESSION: Persistent hazy opacity in the right upper lobe with likely new cavitation at the site of pulmonary infarct on prior CT. Electronically Signed   By: Drusilla Kanner M.D.   On: 10/09/2019 12:10   US Abdomen Limited RUQ  Result Date:  10/09/2019 CLINICAL DATA:  Cirrhosis. EXAM: ULTRASOUND ABDOMEN LIMITED RIGHT UPPER QUADRANT COMPARISON:  None. FINDINGS: Gallbladder: No gallstones or wall thickening visualized. No sonographic Murphy sign noted by sonographer. Common bile duct: Diameter: 0.2 cm. Liver: No focal lesion identified. Within normal limits in parenchymal echogenicity. Portal vein is patent on color Doppler imaging with normal direction of blood flow towards the liver. Other: None. IMPRESSION: No sonographic abnormality identified in the liver. Electronically Signed   By:  Emmaline Kluver M.D.   On: 10/09/2019 16:03    Procedures Procedures (including critical care time)  Medications Ordered in ED Medications  nicotine (NICODERM CQ - dosed in mg/24 hours) patch 14 mg (14 mg Transdermal Not Given 10/09/19 1604)  levothyroxine (SYNTHROID) tablet 250 mcg (has no administration in time range)  vitamin B-12 (CYANOCOBALAMIN) tablet 500 mcg (has no administration in time range)  cholecalciferol (VITAMIN D3) tablet 2,000 Units (has no administration in time range)  acetaminophen (TYLENOL) tablet 650 mg (has no administration in time range)    Or  acetaminophen (TYLENOL) suppository 650 mg (has no administration in time range)  guaiFENesin (MUCINEX) 12 hr tablet 1,200 mg (has no administration in time range)  heparin ADULT infusion 100 units/mL (25000 units/221mL sodium chloride 0.45%) (1,050 Units/hr Intravenous New Bag/Given 10/09/19 1603)  iohexol (OMNIPAQUE) 350 MG/ML injection 75 mL (75 mLs Intravenous Contrast Given 10/09/19 1335)  heparin bolus via infusion 2,000 Units (2,000 Units Intravenous Bolus from Bag 10/09/19 1601)     Initial Impression / Assessment and Plan / ED Course  I have reviewed the triage vital signs and the nursing notes.  Pertinent labs & imaging results that were available during my care of the patient were reviewed by me and considered in my medical decision making (see chart for  details).  Patient was seen and examined- patient is currently hemodynamically stable and states that he is comfortable. Will get Chest XR to rule out obvious infection and based on these results, may possibly proceed with CT angio chest. Patient denies presence of pain so will defer pain medication use. CxR shows persistent RUL opacity and new cavitation at site of pulmonary infarct. Will order CT angio chest to continue workup of cavitation and shows large cavity. Will consult pulmonology.  Pulmonology consult recs that patient gets admitted with initiation of heparin for anticoagulation and d/c eliquis. Pt will be admitted, awaiting bed placement. Hospitalist team consulted to admit patient.    Final Clinical Impressions(s) / ED Diagnoses   Final diagnoses:  Cavitary lesion of lung    New Prescriptions New Prescriptions   No medications on file

## 2019-10-09 NOTE — ED Triage Notes (Signed)
Pt. Stated, I was admitted to [facility] cause I have blood clots in my lungs.

## 2019-10-09 NOTE — ED Triage Notes (Signed)
Pt. Stated, [facility] for follow up. Ive been coughing up blood.

## 2019-10-09 NOTE — ED Notes (Signed)
Patient transported to CT 

## 2019-10-09 NOTE — Progress Notes (Signed)
ANTICOAGULATION CONSULT NOTE - Initial Consult  Pharmacy Consult for Heparin Indication: pulmonary embolus  No Known Allergies  Patient Measurements: Height: 6' (182.9 cm) Weight: 65.8 kg (145 lb) IBW/kg (Calculated) : 77.6 Heparin Dosing Weight: 65.8  Vital Signs: Temp: 97.9 F (36.6 C) (07/17 1025) Temp Source: Oral (07/17 1025) BP: 128/78 (07/17 1315) Pulse Rate: 81 (07/17 1418)  Labs: Recent Labs    10/09/19 1046 10/09/19 1140  HGB 13.7  --   HCT 40.8  --   PLT 420*  --   LABPROT  --  17.5*  INR  --  1.5*  CREATININE 0.99  --     Estimated Creatinine Clearance: 66.5 mL/min (by C-G formula based on SCr of 0.99 mg/dL).   Medical History: Past Medical History:  Diagnosis Date  . Arthritis   . Hypothyroidism   . Thyroid disease     Medications:  (Not in a hospital admission)  Assessment: 69 yo male with recent CT confirmed occlusive right sided PE and non occlusive left sided PE with heart strain on 7/7. Treated with IV heparin and discharged on apixaban.  Returned to ED today with progressive posterior right upper lobe pulmonary infarct with some cavitation and hemoptysis.  Last dose of apixaban today 7/17 at 0700. CBC wnl.  Goal of Therapy:  aPTT 66-120 seconds Monitor platelets by anticoagulation protocol: Yes   Plan:  - Heparin 2000 units IV bolus; half of normal bolus d/t progression of PE, hemoptysis, and recent apixaban dose. - Start heparin infusion at 1050 units/hr - Will monitor heparin using aPTT until aPTT and heparin level correlate. - Check aPTT level in 6 hours and daily while on heparin - Continue to monitor H&H and platelets  Laverna Peace, PharmD PGY-1 Pharmacy Resident 10/09/2019 3:33 PM

## 2019-10-09 NOTE — ED Notes (Signed)
Help get patient undress on the monitor patient is resting with call bell in reach got patient some warm blankets 

## 2019-10-09 NOTE — ED Provider Notes (Signed)
[facility]   CSN: [id number] Arrival date & time: 10/09/19  1017     History Chief Complaint  Patient presents with  . Hemoptysis    coughing up blood    Wilmon Palierry Hilgeman is a 69 y.o. male.  HPI Patient with recent history of pulmonary embolism presents with new hemoptysis, cough. Patient notes that he has been taking his medication as directed, but over the past 10 days has developed new hemoptysis, cough. No true dyspnea, though he does note some mild shortness of breath occasionally. Mild chest tightness, some feverish sensation last night, but no objective fever.     Past Medical History:  Diagnosis Date  . Arthritis   . Hypothyroidism   . Thyroid disease     Patient Active Problem List   Diagnosis Date Noted  . Hematochezia 10/09/2019  . Acute pulmonary embolism (HCC) 09/29/2019  . Hypothyroidism 09/29/2019  . Tobacco use 09/29/2019  . Alcohol use 09/29/2019    Past Surgical History:  Procedure Laterality Date  . NO PAST SURGERIES         Family History  Problem Relation Age of Onset  . Hypertension Mother   . Hypertension Father   . CAD Father     Social History   Tobacco Use  . Smoking status: Current Every Day Smoker    Types: Cigars  . Smokeless tobacco: Never Used  . Tobacco comment: smokes 3-4 cigars a day  Vaping Use  . Vaping Use: Never used  Substance Use Topics  . Alcohol use: Yes    Comment: 64-72 ounces beer most days   . Drug use: Never    Home Medications Prior to Admission medications   Medication Sig Start Date End Date Taking? Authorizing Provider  APIXABAN (ELIQUIS) VTE STARTER PACK (10MG  AND 5MG ) Take as directed on package: start with two-5mg  tablets twice daily for 7 days. On day 8, switch to one-5mg  tablet twice daily. Patient taking differently: Take 5-10 mg by mouth See admin instructions. Take as directed on package: start with two-5mg  tablets twice daily for 7  days. On day 8, switch to one-5mg  tablet twice daily. 10/01/19  Yes Tyrone NineGrunz, Ryan B, MD  aspirin EC 81 MG tablet Take 324 mg by mouth 2 (two) times daily as needed (headache/pain). Swallow whole.   Yes [provider]  Cholecalciferol (VITAMIN D3) 50 MCG (2000 UT) TABS Take 2,000 Units by mouth daily.   Yes [provider]  levothyroxine (SYNTHROID) 125 MCG tablet Take 250 mcg by mouth daily before breakfast.   Yes [provider]  sildenafil (VIAGRA) 100 MG tablet Take 50 mg by mouth daily as needed for erectile dysfunction.   Yes [provider]  vitamin B-12 (CYANOCOBALAMIN) 500 MCG tablet Take 500 mcg by mouth daily.   Yes [provider]  nicotine (NICODERM CQ - DOSED IN MG/24 HOURS) 14 mg/24hr patch Place 1 patch (14 mg total) onto the skin daily. Patient not taking: Reported on 10/09/2019 10/02/19   Tyrone NineGrunz, Ryan B, MD    Allergies    Patient has no known allergies.  Review of Systems   Review of Systems  Constitutional:       Per HPI, otherwise negative  HENT:       Per HPI, otherwise negative  Respiratory:       Per HPI, otherwise negative  Cardiovascular:       Per HPI, otherwise negative  Gastrointestinal: Negative for vomiting.  Endocrine:  Negative aside from HPI  Genitourinary:       Neg aside from HPI   Musculoskeletal:       Per HPI, otherwise negative  Skin: Negative.   Neurological: Negative for syncope.    Physical Exam Updated Vital Signs BP 128/78   Pulse 81   Temp 97.9 F (36.6 C) (Oral)   Resp 18   Ht 6' (1.829 m)   Wt 65.8 kg   SpO2 97%   BMI 19.67 kg/m   Physical Exam Vitals and nursing note reviewed.  Constitutional:      General: He is not in acute distress.    Appearance: He is well-developed.  HENT:     Head: Normocephalic and atraumatic.  Eyes:     Conjunctiva/sclera: Conjunctivae normal.  Cardiovascular:     Rate and Rhythm: Normal rate and regular rhythm.  Pulmonary:     Effort:  Pulmonary effort is normal. No respiratory distress.     Breath sounds: No stridor.  Abdominal:     General: There is no distension.  Skin:    General: Skin is warm and dry.  Neurological:     Mental Status: He is alert and oriented to person, place, and time.      ED Results / Procedures / Treatments   Labs (all labs ordered are listed, but only abnormal results are displayed) Labs Reviewed  CBC WITH DIFFERENTIAL/PLATELET - Abnormal; Notable for the following components:      Result Value   WBC 12.0 (*)    Platelets 420 (*)    Neutro Abs 10.3 (*)    Lymphs Abs 0.6 (*)    All other components within normal limits  COMPREHENSIVE METABOLIC PANEL - Abnormal; Notable for the following components:   Sodium 133 (*)    Chloride 93 (*)    Glucose, Bld 143 (*)    Albumin 2.4 (*)    AST 63 (*)    ALT 82 (*)    All other components within normal limits  PROTIME-INR - Abnormal; Notable for the following components:   Prothrombin Time 17.5 (*)    INR 1.5 (*)    All other components within normal limits  ETHANOL  HIV ANTIBODY (ROUTINE TESTING W REFLEX)  PROCALCITONIN  APTT     Radiology CT Angio Chest PE W/Cm &/Or Wo Cm  Result Date: 10/09/2019 CLINICAL DATA:  69 year old with history of pulmonary embolism. Shortness of breath. Hemoptysis. EXAM: CT ANGIOGRAPHY CHEST WITH CONTRAST TECHNIQUE: Multidetector CT imaging of the chest was performed using the standard protocol during bolus administration of intravenous contrast. Multiplanar CT image reconstructions and MIPs were obtained to evaluate the vascular anatomy. CONTRAST:  71mL OMNIPAQUE IOHEXOL 350 MG/ML SOLN COMPARISON:  09/29/2019 FINDINGS: Cardiovascular: Again noted are right-sided pulmonary emboli. There is thrombus in a segmental branch of the right upper lobe on sequence 7, image 108. There is partial recanalization of this vessel compared to the previous examination. Decreased clot burden in the right lower lobe pulmonary  arteries. There is still a small amount of submental pulmonary emboli in the right lower lobe. Markedly decreased clot burden in the left lower lobe segmental branches. Evidence for minimal residual clot in the left lower lobe. Main pulmonary arteries are widely patent. Heart size is stable. No significant pericardial fluid. Atherosclerotic calcifications involving the abdominal aorta. Coronary artery calcifications. Proximal descending thoracic aorta measures 3.2 cm and stable. Mediastinum/Nodes: Mediastinal structures are unremarkable without significant lymph node enlargement. No significant thyroid tissue is appreciated. Lungs/Pleura:  Trachea and mainstem bronchi are patent. Parenchymal disease in the posterior right upper lobe corresponds with the large segmental pulmonary embolism in the right upper lobe branch. The consolidation in the posterior right upper lobe has progressed and now there is a large amount of gas within this area of consolidation. Findings are most compatible with pulmonary necrosis secondary to a pulmonary infarct. Hazy patchy opacities in the right lower lobe particularly in the superior segment of the right lower lobe. Disease in the right lower lobe may have slightly progressed. No significant airspace disease or consolidation in the left lung. No large pleural effusions. Upper Abdomen: Visualized upper abdominal structures are within normal limits. Musculoskeletal: No acute bone abnormality. Review of the MIP images confirms the above findings. IMPRESSION: 1. Progressive consolidation in the posterior right upper lobe with evidence of necrosis. This area of necrosis and pulmonary infarct corresponds with a large segmental pulmonary embolism from the right upper lobe pulmonary artery. Pulmonary necrosis likely accounts for the patient's hemoptysis. 2. Pulmonary embolism clot burden has markedly decreased. Minimal clot remaining in the left lung. There is residual clot in the posterior  right upper lobe corresponding with the necrotic/cavitary lung disease. 3. Slightly increased parenchymal disease in the right lower lobe is nonspecific but could represent a developing infectious or inflammatory process. Electronically Signed   By: Richarda Overlie M.D.   On: 10/09/2019 14:28   DG Chest Port 1 View  Result Date: 10/09/2019 CLINICAL DATA:  Chest pain and shortness of breath. Hemoptysis. Patient diagnosed with pulmonary emboli 09/29/2019. EXAM: PORTABLE CHEST 1 VIEW COMPARISON:  CT chest and single view of the chest scratch the CT chest and PA and lateral chest 09/29/2019. FINDINGS: Hazy airspace opacity in the right upper lung zone persists. There appears to be new cavitation present. The left lung is clear. No pneumothorax or pleural effusion. Heart size is normal. Atherosclerosis noted. IMPRESSION: Persistent hazy opacity in the right upper lobe with likely new cavitation at the site of pulmonary infarct on prior CT. Electronically Signed   By: Drusilla Kanner M.D.   On: 10/09/2019 12:10    Procedures Procedures (including critical care time)  Medications Ordered in ED Medications  nicotine (NICODERM CQ - dosed in mg/24 hours) patch 14 mg (has no administration in time range)  levothyroxine (SYNTHROID) tablet 250 mcg (has no administration in time range)  vitamin B-12 (CYANOCOBALAMIN) tablet 500 mcg (has no administration in time range)  cholecalciferol (VITAMIN D3) tablet 2,000 Units (has no administration in time range)  acetaminophen (TYLENOL) tablet 650 mg (has no administration in time range)    Or  acetaminophen (TYLENOL) suppository 650 mg (has no administration in time range)  guaiFENesin (MUCINEX) 12 hr tablet 1,200 mg (has no administration in time range)  heparin bolus via infusion 2,000 Units (has no administration in time range)  heparin ADULT infusion 100 units/mL (25000 units/228mL sodium chloride 0.45%) (has no administration in time range)  iohexol (OMNIPAQUE) 350  MG/ML injection 75 mL (75 mLs Intravenous Contrast Given 10/09/19 1335)    ED Course  I have reviewed the triage vital signs and the nursing notes.  Pertinent labs & imaging results that were available during my care of the patient were reviewed by me and considered in my medical decision making (see chart for details).   With consideration of pneumonia versus PE versus infection, patient had x-ray, with plan for CT. Initial x-ray concerning for opacification right upper lobe, subsequent CT concerning for cavitary lesion.  I  discussed this case with pulmonology colleagues.  This adult male with recent PE diagnosis now presents with new hemoptysis, is found to have right cavitary lesion, requiring initiation of a heparin drip, discussion with pulmonary critical care, admission for further monitoring, management.  MDM Rules/Calculators/A&P MDM Number of Diagnoses or Management Options Cavitary lesion of lung: new, needed workup   Amount and/or Complexity of Data Reviewed Clinical lab tests: reviewed Tests in the radiology section of CPT: reviewed Tests in the medicine section of CPT: reviewed Decide to obtain previous medical records or to obtain history from someone other than the patient: yes Obtain history from someone other than the patient: yes Review and summarize past medical records: yes Discuss the patient with other providers: yes Independent visualization of images, tracings, or specimens: yes  Risk of Complications, Morbidity, and/or Mortality Presenting problems: high Diagnostic procedures: high Management options: high  Critical Care Total time providing critical care: 30-74 minutes  Patient Progress Patient progress: stable  Final Clinical Impression(s) / ED Diagnoses Final diagnoses:  Cavitary lesion of lung     Gerhard Munch, MD 10/09/19 [phone]

## 2019-10-09 NOTE — Plan of Care (Signed)

## 2019-10-09 NOTE — Consult Note (Signed)
NAME:  Chad Meyer, MRN:  [id number], DOB:  [birth year], LOS: 0 ADMISSION DATE:  10/09/2019, CONSULTATION DATE: 10/09/2019 REFERRING MD: Dr. Jeraldine Loots, ED, CHIEF COMPLAINT: Hemoptysis, abnormal CT chest  Brief History   69 year old man with a history of hypothyroidism, osteoarthritis, tobacco use (cigars).  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  He was having some mild hemoptysis, sputum mixed with some red at the time of that diagnosis.  Discharged on Eliquis.  Returns now with an interval increase in his hemoptysis over the last several days although he has not seen any today.  Describes this as blood mixed in with clear sputum.  He gauges that he has produced approximately a cup of this kind of sputum a day for the last several days.  His dyspnea has improved significantly.  No chest pain.  Denies any fever, purulent mucus, sweats, etc.  Past Medical History   Past Medical History:  Diagnosis Date  . Arthritis   . Hypothyroidism   . Thyroid disease   Pulmonary embolism 09/29/19   Significant Hospital Events     Consults:  PCCM  Procedures:    Significant Diagnostic Tests:  CT chest 09/29/2019 >> bilateral pulmonary emboli and segmental arteries posterior right upper lobe, left lower lobe, right lower lobe with patchy posterior right upper lobe infiltrate consistent with infarcted lung.  Echocardiogram 09/30/19 >> LVEF 55-60%, grade 1 diastolic dysfunction, normal RV size and function, unable to assess PA pressure  CT chest 10/09/2019 >> no new PE, some recanalization of his posterior right upper lobe pulmonary artery that was previously fully occluded, decreased clot burden in both the right lower lobe and the left lower lobe.  Significant change in area of posterior right upper lobe consolidation now with a thick-walled cavitary component.  Also some mild hazy right lower lobe changes that are new, consistent with possible  mild infarct in this region as well.  Micro Data:    Antimicrobials:    Interim history/subjective:  As per HPI  Objective   Blood pressure 128/78, pulse 81, temperature 97.9 F (36.6 C), temperature source Oral, resp. rate 18, height 6' (1.829 m), weight 65.8 kg, SpO2 97 %.       No intake or output data in the 24 hours ending 10/09/19 1443 Filed Weights   10/09/19 1021  Weight: 65.8 kg    Examination: General: Thin well-appearing man, no distress laying in bed HENT: Oropharynx clear, no blood in his posterior pharynx or nasal cavity Lungs: Clear bilaterally, no wheezing, no crackles Cardiovascular: Regular, distant, no murmur Abdomen: Nondistended, positive bowel sounds Extremities: No lower extremity edema, no cords, no calf tenderness Neuro: Awake, alert, appropriate, nonfocal  Resolved Hospital Problem list     Assessment & Plan:   Hemoptysis due to pulmonary emboli, now progressive posterior right upper lobe pulmonary infarct with some cavitation.  No clinical evidence for infection to support that this cavity represents abscess, pneumonia, although WBC 12.  He was having some low-grade hemoptysis even at his original diagnosis so unclear whether the increase in bleeding relates to the cavity formation versus the initiation of his anticoagulation.  Regardless, because his infarct is evolving and he is developing cavitation he is at risk for worsening hemoptysis, even massive hemoptysis. -Recommend that he be admitted for observation off Eliquis, on heparin infusion so this can be discontinued immediately if he has progression of bleeding. -Would follow this way for at least few days to  ensure that hemoptysis does not increase as his infarct evolves.  Plan will be to transition him back to Eliquis when/if we believe it is safe to do so -If his bleeding increases and we do not feel comfortable continuing maintenance anticoagulation then will need to consider IVC filter  placement to protect from recurrent PE while he has a break from anticoagulation -Watch for any evolution of purulent sputum, fever, infectious symptoms as his cavity will be at some risk for pneumonia, abscess formation.  I would not place him on empiric antibiotics at this time   Labs   CBC: Recent Labs  Lab 10/09/19 1046  WBC 12.0*  NEUTROABS 10.3*  HGB 13.7  HCT 40.8  MCV 92.5  PLT 420*    Basic Metabolic Panel: Recent Labs  Lab 10/09/19 1046  NA 133*  K 3.9  CL 93*  CO2 27  GLUCOSE 143*  BUN 15  CREATININE 0.99  CALCIUM 9.1   GFR: Estimated Creatinine Clearance: 66.5 mL/min (by C-G formula based on SCr of 0.99 mg/dL). Recent Labs  Lab 10/09/19 1046  WBC 12.0*    Liver Function Tests: Recent Labs  Lab 10/09/19 1046  AST 63*  ALT 82*  ALKPHOS 77  BILITOT 0.5  PROT 7.6  ALBUMIN 2.4*   No results for input(s): LIPASE, AMYLASE in the last 168 hours. No results for input(s): AMMONIA in the last 168 hours.  ABG No results found for: PHART, PCO2ART, PO2ART, HCO3, TCO2, ACIDBASEDEF, O2SAT   Coagulation Profile: Recent Labs  Lab 10/09/19 1140  INR 1.5*    Cardiac Enzymes: No results for input(s): CKTOTAL, CKMB, CKMBINDEX, TROPONINI in the last 168 hours.  HbA1C: No results found for: HGBA1C  CBG: No results for input(s): GLUCAP in the last 168 hours.  Review of Systems:   As per Hpi  Past Medical History  He,  has a past medical history of Arthritis, Hypothyroidism, and Thyroid disease.   Surgical History    Past Surgical History:  Procedure Laterality Date  . NO PAST SURGERIES       Social History   reports that he has been smoking cigars. He has never used smokeless tobacco. He reports current alcohol use. He reports that he does not use drugs.   Family History   His family history includes CAD in his father; Hypertension in his father and mother.   Allergies No Known Allergies   Home Medications  Prior to Admission  medications   Medication Sig Start Date End Date Taking? Authorizing Provider  APIXABAN (ELIQUIS) VTE STARTER PACK (10MG  AND 5MG ) Take as directed on package: start with two-5mg  tablets twice daily for 7 days. On day 8, switch to one-5mg  tablet twice daily. 10/01/19   , MD  Cholecalciferol (VITAMIN D3) 50 MCG (2000 UT) TABS Take 2,000 Units by mouth daily.    [provider]  levothyroxine (SYNTHROID) 125 MCG tablet Take 250 mcg by mouth daily before breakfast.    [provider]  nicotine (NICODERM CQ - DOSED IN MG/24 HOURS) 14 mg/24hr patch Place 1 patch (14 mg total) onto the skin daily. 10/02/19   Tyrone Nine, MD  sildenafil (VIAGRA) 100 MG tablet Take 50 mg by mouth daily as needed for erectile dysfunction.    [provider]  vitamin B-12 (CYANOCOBALAMIN) 500 MCG tablet Take 500 mcg by mouth daily.    [provider]     Critical care time: N/A     12/03/19, MD,  PhD 10/09/2019, 3:00 PM [facility] [phone] or if no answer [phone]

## 2019-10-10 DIAGNOSIS — R042 Hemoptysis: Secondary | ICD-10-CM | POA: Diagnosis present

## 2019-10-10 LAB — TSH: TSH: 0.755 u[IU]/mL (ref 0.350–4.500)

## 2019-10-10 LAB — CBC
HCT: 36.4 % — ABNORMAL LOW (ref 39.0–52.0)
Hemoglobin: 12.7 g/dL — ABNORMAL LOW (ref 13.0–17.0)
MCH: 32.1 pg (ref 26.0–34.0)
MCHC: 34.9 g/dL (ref 30.0–36.0)
MCV: 91.9 fL (ref 80.0–100.0)
Platelets: 396 10*3/uL (ref 150–400)
RBC: 3.96 MIL/uL — ABNORMAL LOW (ref 4.22–5.81)
RDW: 15.2 % (ref 11.5–15.5)
WBC: 11.4 10*3/uL — ABNORMAL HIGH (ref 4.0–10.5)
nRBC: 0 % (ref 0.0–0.2)

## 2019-10-10 LAB — APTT
aPTT: 33 seconds (ref 24–36)
aPTT: 34 seconds (ref 24–36)
aPTT: 52 seconds — ABNORMAL HIGH (ref 24–36)

## 2019-10-10 LAB — HEPARIN LEVEL (UNFRACTIONATED)
Heparin Unfractionated: 1.7 IU/mL — ABNORMAL HIGH (ref 0.30–0.70)
Heparin Unfractionated: 1.4 IU/mL — ABNORMAL HIGH (ref 0.30–0.70)
Heparin Unfractionated: 1.32 IU/mL — ABNORMAL HIGH (ref 0.30–0.70)

## 2019-10-10 MED ORDER — HEPARIN BOLUS VIA INFUSION
2000.0000 [IU] | Freq: Once | INTRAVENOUS | Status: AC
  Administered 2019-10-10: 2000 [IU] via INTRAVENOUS
  Filled 2019-10-10: qty 2000

## 2019-10-10 NOTE — Progress Notes (Signed)
NAME:  Chad Meyer, MRN:  [id number], DOB:  12-13-1950, LOS: 1 ADMISSION DATE:  10/09/2019, CONSULTATION DATE: 10/09/2019 REFERRING MD: Dr. Jeraldine Loots, ED, CHIEF COMPLAINT: Hemoptysis, abnormal CT chest  Brief History   69 year old man with a history of hypothyroidism, osteoarthritis, tobacco use (cigars).  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  He was having some mild hemoptysis, sputum mixed with some red at the time of that diagnosis.  Discharged on Eliquis.  Returns now with an interval increase in his hemoptysis over the last several days although he has not seen any today.  Describes this as blood mixed in with clear sputum.  He gauges that he has produced approximately a cup of this kind of sputum a day for the last several days.  His dyspnea has improved significantly.  No chest pain.  Denies any fever, purulent mucus, sweats, etc.  Past Medical History   Past Medical History:  Diagnosis Date  . Arthritis   . Hypothyroidism   . Thyroid disease   Pulmonary embolism 09/29/19   Significant Hospital Events     Consults:  PCCM  Procedures:    Significant Diagnostic Tests:  CT chest 09/29/2019 >> bilateral pulmonary emboli and segmental arteries posterior right upper lobe, left lower lobe, right lower lobe with patchy posterior right upper lobe infiltrate consistent with infarcted lung.  Echocardiogram 09/30/19 >> LVEF 55-60%, grade 1 diastolic dysfunction, normal RV size and function, unable to assess PA pressure  CT chest 10/09/2019 >> no new PE, some recanalization of his posterior right upper lobe pulmonary artery that was previously fully occluded, decreased clot burden in both the right lower lobe and the left lower lobe.  Significant change in area of posterior right upper lobe consolidation now with a thick-walled cavitary component.  Also some mild hazy right lower lobe changes that are new, consistent with possible  mild infarct in this region as well.  Micro Data:    Antimicrobials:    Interim history/subjective:   Seeing blood mixed in with mucous / saliva. Does not appear to be large volume and stable compared with last week. No fever, chills. Reports good appetite  Objective   Blood pressure 109/65, pulse 82, temperature 99.6 F (37.6 C), temperature source Oral, resp. rate 18, height 6' (1.829 m), weight 65.8 kg, SpO2 97 %.        Intake/Output Summary (Last 24 hours) at 10/10/2019 1140 Last data filed at 10/10/2019 0745 Gross per 24 hour  Intake 240 ml  Output 775 ml  Net -535 ml   Filed Weights   10/09/19 1021  Weight: 65.8 kg    Examination: Completely unchanged from 7/17 General: Thin well-appearing man, no distress laying in bed HENT: Oropharynx clear, no blood in his posterior pharynx or nasal cavity Lungs: Clear bilaterally, no wheezing, no crackles Cardiovascular: Regular, distant, no murmur Abdomen: Nondistended, positive bowel sounds Extremities: No lower extremity edema, no cords, no calf tenderness Neuro: Awake, alert, appropriate, nonfocal  Resolved Hospital Problem list     Assessment & Plan:   Hemoptysis due to pulmonary emboli and progressive posterior right upper lobe pulmonary infarct with some cavitation.  Still clinical evidence for infection to support that this cavity represents abscess, pneumonia.  He was having some low-grade hemoptysis even at his original diagnosis so unclear whether the increase in bleeding relates to the cavity formation versus the initiation of his anticoagulation.  Regardless, because his infarct is evolving and he  is developing cavitation he is at risk for worsening hemoptysis, even massive hemoptysis. So far his bleeding has been small amount and stable since changing to heparin for observation.  - would watch him for another day on heparin to gauge amount hemoptysis, insure no increase - watch for any fevers, purulent sputum other  signs that would suggest evolving PNA or abscess - If bleeding increases such that we do not feel comfortable with anti-coagulation of any kind, then we will have to pursue IVC filter so anticoag can be held longer - can try to get him back to Eliquis if stable for another day.     Labs   CBC: Recent Labs  Lab 10/09/19 1046 10/10/19 0118  WBC 12.0* 11.4*  NEUTROABS 10.3*  --   HGB 13.7 12.7*  HCT 40.8 36.4*  MCV 92.5 91.9  PLT 420* 396    Basic Metabolic Panel: Recent Labs  Lab 10/09/19 1046 10/09/19 2215  NA 133*  --   K 3.9  --   CL 93*  --   CO2 27  --   GLUCOSE 143*  --   BUN 15  --   CREATININE 0.99  --   CALCIUM 9.1  --   MG  --  2.1  PHOS  --  2.9   GFR: Estimated Creatinine Clearance: 66.5 mL/min (by C-G formula based on SCr of 0.99 mg/dL). Recent Labs  Lab 10/09/19 1046 10/09/19 1812 10/10/19 0118  PROCALCITON  --  0.12  --   WBC 12.0*  --  11.4*    Liver Function Tests: Recent Labs  Lab 10/09/19 1046  AST 63*  ALT 82*  ALKPHOS 77  BILITOT 0.5  PROT 7.6  ALBUMIN 2.4*     Critical care time: N/A     Levy Pupa, MD, PhD 10/10/2019, 11:40 AM  Pulmonary and Critical Care [phone] or if no answer [phone]

## 2019-10-10 NOTE — Progress Notes (Signed)
PROGRESS NOTE  Chad Meyer  DOB: August 31, 1950  PCP: Patient, No Pcp Per LKG:[id number]  DOA: 10/09/2019  LOS: 1 day   Chief Complaint  Patient presents with  . Hemoptysis    coughing up blood   Brief narrative: Chad Meyer is a 69 y.o. male with medical history significant of recently diagnosed PE with right heart strain, hypothyroidism, alcohol abuse and tobacco abuse. Patient presented to ED on 7/17 with hemoptysis.  7/7, CTA chest showed PE on right lung, echo showed right heart strain with RV:LV=1:1.  He was started on Eliquis and discharged home. Since then, patient has had occasional coughing up blood clots, small amount intermittent, denies any chest pain or shortness of breath, no fever, chills.  2 days ago,  patient started coughing up blood clots which became more frequent and severe.  On the day of admission, patient coughed up about 1 cup / 12 ounce of blood clots.  He then came to ED for evaluation.  In the ED, CTA chest showed right upper lung necrosis with blood clot burden improvement of the right side lung.   No hypoxia or tachycardia, WBC 12, hemoglobin 13.7.  Subjective: Patient was seen and examined this morning.  Lying down in bed.  Not in distress.  Has intermittent coughing up episodes.  He has had episode of rusty phlegm with some streaks of blood but no gross vomiting of blood  Assessment/Plan: Hemoptysis Recently started anticoagulation for pulmonary embolism -Hemoptysis likely from pulmonary necrosis of the right upper lung which showed up on the repeat CT scan of chest.   -Pulmonary consult appreciated.   -Per pulmonary recommendation, patient was switched from Eliquis to heparin drip.    Leukocytosis -Probably related to necrotic tissue absorption -No clear aerobronchogram on CT, check procalcitonin, hold off antibiotics for now  Hypoalbuminemia -Albumin level low at 2.4.  Liver ultrasound normal.    Tobacco abuse -Continue Nicotine  patch.  Mobility: Encourage ambulation Code Status:   Code Status: Full Code  Nutritional status: Body mass index is 19.67 kg/m.     Diet Order            Diet regular Room service appropriate? Yes; Fluid consistency: Thin  Diet effective now                 DVT prophylaxis: Heparin drip   Antimicrobials:  None Fluid: None  Consultants: Pulmonology Family Communication:  Not at bedside  Status is: Inpatient  Remains inpatient appropriate because:Ongoing diagnostic testing needed not appropriate for outpatient work up and IV treatments appropriate due to intensity of illness or inability to take PO   Dispo: The patient is from: Home              Anticipated d/c is to: Home              Anticipated d/c date is: 2 days              Patient currently is not medically stable to d/c.       Infusions:  . heparin 1,550 Units/hr (10/10/19 1150)    Scheduled Meds: . cholecalciferol  2,000 Units Oral Daily  . guaiFENesin  1,200 mg Oral BID  . levothyroxine  250 mcg Oral Q0600  . metoprolol tartrate  12.5 mg Oral BID  . nicotine  14 mg Transdermal Daily  . vitamin B-12  500 mcg Oral Daily    Antimicrobials: Anti-infectives (From admission, onward)   None  PRN meds: acetaminophen **OR** acetaminophen   Objective: Vitals:   10/10/19 0908 10/10/19 1214  BP: 109/65 121/68  Pulse: 82 90  Resp:  18  Temp:  100.2 F (37.9 C)  SpO2:  98%    Intake/Output Summary (Last 24 hours) at 10/10/2019 1405 Last data filed at 10/10/2019 1324 Gross per 24 hour  Intake 480 ml  Output 950 ml  Net -470 ml   Filed Weights   10/09/19 1021  Weight: 65.8 kg   Weight change:  Body mass index is 19.67 kg/m.   Physical Exam: General exam: Appears calm and comfortable.  Not in distress Skin: No rashes, lesions or ulcers. HEENT: Atraumatic, normocephalic, supple neck, no obvious bleeding Lungs: Clear to auscultation bilaterally CVS: Regular rate and rhythm, no  murmur GI/Abd soft, nontender, nondistended, bowel sound present CNS: Alert, awake, oriented x3 Psychiatry: Mood appropriate Extremities: No pedal edema, no calf tenderness  Data Review: I have personally reviewed the laboratory data and studies available.  Recent Labs  Lab 10/09/19 1046 10/10/19 0118  WBC 12.0* 11.4*  NEUTROABS 10.3*  --   HGB 13.7 12.7*  HCT 40.8 36.4*  MCV 92.5 91.9  PLT 420* 396   Recent Labs  Lab 10/09/19 1046 10/09/19 2215  NA 133*  --   K 3.9  --   CL 93*  --   CO2 27  --   GLUCOSE 143*  --   BUN 15  --   CREATININE 0.99  --   CALCIUM 9.1  --   MG  --  2.1  PHOS  --  2.9    Signed, Lorin Glass, MD [facility] Pager: [phone] (Secure Chat preferred). 10/10/2019

## 2019-10-10 NOTE — Progress Notes (Signed)
ANTICOAGULATION CONSULT NOTE  Pharmacy Consult for Heparin Indication: pulmonary embolus  No Known Allergies  Patient Measurements: Height: 6' (182.9 cm) Weight: 65.8 kg (145 lb) IBW/kg (Calculated) : 77.6 Heparin Dosing Weight: 65.8  Vital Signs: Temp: 100.3 F (37.9 C) (07/18 2001) Temp Source: Oral (07/18 2001) BP: 115/68 (07/18 2001) Pulse Rate: 86 (07/18 2001)  Labs: Recent Labs    10/09/19 1046 10/09/19 1140 10/09/19 2215 10/10/19 0118 10/10/19 0644 10/10/19 1328 10/10/19 2140  HGB 13.7  --   --  12.7*  --   --   --   HCT 40.8  --   --  36.4*  --   --   --   PLT 420*  --   --  396  --   --   --   APTT  --   --    < >  --  33 34 52*  LABPROT  --  17.5*  --   --   --   --   --   INR  --  1.5*  --   --   --   --   --   HEPARINUNFRC  --   --   --   --  1.70* 1.40*  --   CREATININE 0.99  --   --   --   --   --   --    < > = values in this interval not displayed.    Estimated Creatinine Clearance: 66.5 mL/min (by C-G formula based on SCr of 0.99 mg/dL).   Assessment: 69 yo male with recent CT confirmed occlusive right sided PE and non occlusive left sided PE with heart strain on 7/7. Treated with IV heparin and discharged on apixaban.  Returned to ED 7/17 with progressive posterior right upper lobe pulmonary infarct with some cavitation and hemoptysis. Last dose of apixaban 7/17 at 0700. Likely effecting heparin levels so will continue to utilize PTT for monitoring until levels correlating.  -aPTT= 53    Goal of Therapy:  aPTT 66-120 seconds; heparin level 0.3-0.7 units/ml Monitor platelets by anticoagulation protocol: Yes   Plan:  - Increase heparin infusion to 1950 units/hr -aPTT and heparin level in am - Monitor CBC and hemoptysis    Harland German, PharmD Clinical Pharmacist **Pharmacist phone directory can now be found on [email or web address] ([email or web address]).  Listed under [facility].

## 2019-10-10 NOTE — Progress Notes (Signed)
ANTICOAGULATION CONSULT NOTE  Pharmacy Consult for Heparin Indication: pulmonary embolus  No Known Allergies  Patient Measurements: Height: 6' (182.9 cm) Weight: 65.8 kg (145 lb) IBW/kg (Calculated) : 77.6 Heparin Dosing Weight: 65.8  Vital Signs: Temp: 100.2 F (37.9 C) (07/18 1214) Temp Source: Oral (07/18 1214) BP: 121/68 (07/18 1214) Pulse Rate: 90 (07/18 1214)  Labs: Recent Labs    10/09/19 1046 10/09/19 1140 10/09/19 2215 10/10/19 0118 10/10/19 0644 10/10/19 1328  HGB 13.7  --   --  12.7*  --   --   HCT 40.8  --   --  36.4*  --   --   PLT 420*  --   --  396  --   --   APTT  --   --  31  --  33 34  LABPROT  --  17.5*  --   --   --   --   INR  --  1.5*  --   --   --   --   HEPARINUNFRC  --   --   --   --  1.70* 1.40*  CREATININE 0.99  --   --   --   --   --     Estimated Creatinine Clearance: 66.5 mL/min (by C-G formula based on SCr of 0.99 mg/dL).   Assessment: 69 yo male with recent CT confirmed occlusive right sided PE and non occlusive left sided PE with heart strain on 7/7. Treated with IV heparin and discharged on apixaban.  Returned to ED 7/17 with progressive posterior right upper lobe pulmonary infarct with some cavitation and hemoptysis.  Last dose of apixaban 7/17 at 0700. Likely effecting heparin levels so will continue to utilize PTT for monitoring until levels correlating.   PTT 34 sec (subtherapeutic) on gtt at 1550 units/hr. H&H down from 13.7/40.8 to 12.7/36.4. No issues with line or bleeding per RN.   Goal of Therapy:  aPTT 66-120 seconds; heparin level 0.3-0.7 units/ml Monitor platelets by anticoagulation protocol: Yes   Plan:  - Re-bolus 2000 units heparin - Increase heparin infusion to 1800 units/hr - PTT and heparin level in 6 hours - Monitor CBC and hemoptysis      Mariaisabel Bodiford L. Arlester Marker, PharmD [facility] PGY2 Pharmacy Resident [phone] 10/10/19      2:51 PM  Please check AMION for all [facility] Pharmacy phone numbers After 10:00 PM,  call the Main Pharmacy [phone]

## 2019-10-10 NOTE — Progress Notes (Signed)
ANTICOAGULATION CONSULT NOTE  Pharmacy Consult for Heparin Indication: pulmonary embolus  No Known Allergies  Patient Measurements: Height: 6' (182.9 cm) Weight: 65.8 kg (145 lb) IBW/kg (Calculated) : 77.6 Heparin Dosing Weight: 65.8  Vital Signs: Temp: 99.7 F (37.6 C) (07/17 2215) Temp Source: Oral (07/17 2215) BP: 122/69 (07/17 2215) Pulse Rate: 86 (07/17 2215)  Labs: Recent Labs    10/09/19 1046 10/09/19 1140 10/09/19 2215  HGB 13.7  --   --   HCT 40.8  --   --   PLT 420*  --   --   APTT  --   --  31  LABPROT  --  17.5*  --   INR  --  1.5*  --   CREATININE 0.99  --   --     Estimated Creatinine Clearance: 66.5 mL/min (by C-G formula based on SCr of 0.99 mg/dL).   Assessment: 69 yo male with recent CT confirmed occlusive right sided PE and non occlusive left sided PE with heart strain on 7/7. Treated with IV heparin and discharged on apixaban.  Returned to ED today with progressive posterior right upper lobe pulmonary infarct with some cavitation and hemoptysis.  Last dose of apixaban 7/17 at 0700. CBC wnl. Likely effecting heparin levels so will utilize PTT for monitoring until levels correlating.  PTT 31 sec (subtherapeutic) on gtt at 1050 units/hr. No issues with line per RN. Scant amount of hemoptysis.  Goal of Therapy:  aPTT 66-120 seconds; heparin level 0.3-0.7 units/ml Monitor platelets by anticoagulation protocol: Yes   Plan:  Rebolus 2000 units heparin Increase heparin infusion to 1300 units/hr PTT and heparin level in 6 hours  Christoper Fabian, PharmD, BCPS Please see amion for complete clinical pharmacist phone list 10/10/2019 12:24 AM

## 2019-10-10 NOTE — Plan of Care (Signed)
  Problem: Education: Goal: Knowledge of General Education information will improve Description: Including pain rating scale, medication(s)/side effects and non-pharmacologic comfort measures 10/10/2019 0736 by Luna Kitchens, RN Outcome: Progressing 10/09/2019 1847 by Luna Kitchens, RN Outcome: Progressing   Problem: Health Behavior/Discharge Planning: Goal: Ability to manage health-related needs will improve 10/10/2019 0736 by Luna Kitchens, RN Outcome: Progressing 10/09/2019 1847 by Luna Kitchens, RN Outcome: Progressing   Problem: Clinical Measurements: Goal: Ability to maintain clinical measurements within normal limits will improve 10/10/2019 0736 by Luna Kitchens, RN Outcome: Progressing 10/09/2019 1847 by Luna Kitchens, RN Outcome: Progressing Goal: Will remain free from infection 10/10/2019 0736 by Luna Kitchens, RN Outcome: Progressing 10/09/2019 1847 by Luna Kitchens, RN Outcome: Progressing Goal: Diagnostic test results will improve 10/10/2019 0736 by Luna Kitchens, RN Outcome: Progressing 10/09/2019 1847 by Luna Kitchens, RN Outcome: Progressing Goal: Respiratory complications will improve 10/10/2019 0736 by Luna Kitchens, RN Outcome: Progressing 10/09/2019 1847 by Luna Kitchens, RN Outcome: Progressing Goal: Cardiovascular complication will be avoided 10/10/2019 0736 by Luna Kitchens, RN Outcome: Progressing 10/09/2019 1847 by Luna Kitchens, RN Outcome: Progressing   Problem: Activity: Goal: Risk for activity intolerance will decrease 10/10/2019 0736 by Luna Kitchens, RN Outcome: Progressing 10/09/2019 1847 by Luna Kitchens, RN Outcome: Progressing   Problem: Nutrition: Goal: Adequate nutrition will be maintained 10/10/2019 0736 by Luna Kitchens, RN Outcome: Progressing 10/09/2019 1847 by Luna Kitchens, RN Outcome: Progressing   Problem: Coping: Goal: Level of anxiety will decrease 10/10/2019 0736 by Luna Kitchens, RN Outcome: Progressing 10/09/2019 1847 by Luna Kitchens, RN Outcome: Progressing   Problem: Elimination: Goal: Will not experience complications related to bowel motility 10/10/2019 0736 by Luna Kitchens, RN Outcome: Progressing 10/09/2019 1847 by Luna Kitchens, RN Outcome: Progressing Goal: Will not experience complications related to urinary retention 10/10/2019 0736 by Luna Kitchens, RN Outcome: Progressing 10/09/2019 1847 by Luna Kitchens, RN Outcome: Progressing   Problem: Pain Managment: Goal: General experience of comfort will improve 10/10/2019 0736 by Luna Kitchens, RN Outcome: Progressing 10/09/2019 1847 by Luna Kitchens, RN Outcome: Progressing   Problem: Safety: Goal: Ability to remain free from injury will improve 10/10/2019 0736 by Luna Kitchens, RN Outcome: Progressing 10/09/2019 1847 by Luna Kitchens, RN Outcome: Progressing   Problem: Skin Integrity: Goal: Risk for impaired skin integrity will decrease 10/10/2019 0736 by Luna Kitchens, RN Outcome: Progressing 10/09/2019 1847 by Luna Kitchens, RN Outcome: Progressing

## 2019-10-10 NOTE — Progress Notes (Signed)
ANTICOAGULATION CONSULT NOTE  Pharmacy Consult for Heparin Indication: pulmonary embolus  No Known Allergies  Patient Measurements: Height: 6' (182.9 cm) Weight: 65.8 kg (145 lb) IBW/kg (Calculated) : 77.6 Heparin Dosing Weight: 65.8  Vital Signs: Temp: 99.6 F (37.6 C) (07/18 0742) Temp Source: Oral (07/18 0742) BP: 112/68 (07/18 0742) Pulse Rate: 84 (07/18 0742)  Labs: Recent Labs    10/09/19 1046 10/09/19 1140 10/09/19 2215 10/10/19 0118 10/10/19 0644  HGB 13.7  --   --  12.7*  --   HCT 40.8  --   --  36.4*  --   PLT 420*  --   --  396  --   APTT  --   --  31  --  33  LABPROT  --  17.5*  --   --   --   INR  --  1.5*  --   --   --   HEPARINUNFRC  --   --   --   --  1.70*  CREATININE 0.99  --   --   --   --     Estimated Creatinine Clearance: 66.5 mL/min (by C-G formula based on SCr of 0.99 mg/dL).   Assessment: 69 yo male with recent CT confirmed occlusive right sided PE and non occlusive left sided PE with heart strain on 7/7. Treated with IV heparin and discharged on apixaban.  Returned to ED 7/17 with progressive posterior right upper lobe pulmonary infarct with some cavitation and hemoptysis.  Last dose of apixaban 7/17 at 0700. Likely effecting heparin levels so will continue to utilize PTT for monitoring until levels correlating.   PTT 33 sec (subtherapeutic) on gtt at 1300 units/hr. H&H down from 13.7/40.8 to 12.7/36.4. No issues with line or bleeding per RN.   Goal of Therapy:  aPTT 66-120 seconds; heparin level 0.3-0.7 units/ml Monitor platelets by anticoagulation protocol: Yes   Plan:  - Re-bolus 2000 units heparin - Increase heparin infusion to 1550 units/hr - PTT and heparin level in 6 hours - Monitor CBC and hemoptysis      Mikeala Girdler L. Arlester Marker, PharmD [facility] PGY2 Pharmacy Resident [phone] 10/10/19      7:56 AM  Please check AMION for all [facility] Pharmacy phone numbers After 10:00 PM, call the Main Pharmacy [phone]

## 2019-10-11 LAB — CBC
HCT: 37.3 % — ABNORMAL LOW (ref 39.0–52.0)
Hemoglobin: 12.9 g/dL — ABNORMAL LOW (ref 13.0–17.0)
MCH: 31.7 pg (ref 26.0–34.0)
MCHC: 34.6 g/dL (ref 30.0–36.0)
MCV: 91.6 fL (ref 80.0–100.0)
Platelets: 419 10*3/uL — ABNORMAL HIGH (ref 150–400)
RBC: 4.07 MIL/uL — ABNORMAL LOW (ref 4.22–5.81)
RDW: 14.8 % (ref 11.5–15.5)
WBC: 12.4 10*3/uL — ABNORMAL HIGH (ref 4.0–10.5)
nRBC: 0 % (ref 0.0–0.2)

## 2019-10-11 LAB — HEPARIN LEVEL (UNFRACTIONATED): Heparin Unfractionated: 1.12 IU/mL — ABNORMAL HIGH (ref 0.30–0.70)

## 2019-10-11 LAB — APTT
aPTT: 50 seconds — ABNORMAL HIGH (ref 24–36)
aPTT: 56 seconds — ABNORMAL HIGH (ref 24–36)

## 2019-10-11 MED ORDER — POLYETHYLENE GLYCOL 3350 17 G PO PACK
17.0000 g | PACK | Freq: Every day | ORAL | Status: DC
  Administered 2019-10-11 – 2019-10-13 (×3): 17 g via ORAL
  Filled 2019-10-11 (×3): qty 1

## 2019-10-11 MED ORDER — HEPARIN BOLUS VIA INFUSION
2000.0000 [IU] | Freq: Once | INTRAVENOUS | Status: AC
  Administered 2019-10-11: 2000 [IU] via INTRAVENOUS
  Filled 2019-10-11: qty 2000

## 2019-10-11 MED ORDER — APIXABAN 5 MG PO TABS
5.0000 mg | ORAL_TABLET | Freq: Two times a day (BID) | ORAL | Status: DC
  Administered 2019-10-11 – 2019-10-13 (×5): 5 mg via ORAL
  Filled 2019-10-11 (×5): qty 1

## 2019-10-11 NOTE — Progress Notes (Signed)
PROGRESS NOTE  Chad Meyer  DOB: 1950-09-30  PCP: Patient, No Pcp Per LOV:[id number]  DOA: 10/09/2019  LOS: 2 days   Chief Complaint  Patient presents with  . Hemoptysis    coughing up blood   Brief narrative: Chad Meyer is a 69 y.o. male with medical history significant of recently diagnosed PE with right heart strain, hypothyroidism, alcohol abuse and tobacco abuse. Patient presented to ED on 7/17 with hemoptysis.  7/7, CTA chest showed PE on right lung, echo showed right heart strain with RV:LV=1:1.  He was started on Eliquis and discharged home. Since then, patient has had occasional coughing up blood clots, small amount intermittent, denies any chest pain or shortness of breath, no fever, chills.  2 days ago,  patient started coughing up blood clots which became more frequent and severe.  On the day of admission, patient coughed up about 1 cup / 12 ounce of blood clots.  He then came to ED for evaluation.  In the ED, CTA chest showed right upper lung necrosis with blood clot burden improvement of the right side lung.   No hypoxia or tachycardia, WBC 12, hemoglobin 13.7.  Subjective: Patient was seen and examined this morning.   Propped up in bed.  Not in distress.  Coughing less streaks of blood now.  Mostly rusty sputum.  Hemoglobin stable and blood work.   Pulmonary consultation appreciated.  Assessment/Plan: Hemoptysis Recently started anticoagulation for pulmonary embolism -Hemoptysis likely from pulmonary necrosis of the right upper lung which showed up on the repeat CT scan of chest.   -Pulmonary consult appreciated.   -Per pulmonary recommendation, patient was switched from Eliquis to heparin drip.   -Hemoptysis eventually stabilized.  Hemoglobin stable.  Noted recommendation from pulmonology to switch back from heparin drip to Eliquis today.  Continue to monitor for next 24 hours for any bleeding episode.  Low-grade fever Leukocytosis -For last 24 hours,  patient is having temperature mostly less than 100.5 and one episode of 100.9.  WBC count mildly elevated to 12.4.  Likely secondary to necrotic tissue absorption in the lungs.   -No clear aerobronchogram on CT, check procalcitonin, hold off antibiotics for now. -Continue to monitor.  Hypoalbuminemia -Albumin level low at 2.4.  Liver ultrasound normal.    Tobacco abuse -Continue Nicotine patch.  Mobility: Encourage ambulation Code Status:   Code Status: Full Code  Nutritional status: Body mass index is 19.67 kg/m.     Diet Order            Diet regular Room service appropriate? Yes; Fluid consistency: Thin  Diet effective now                 DVT prophylaxis:  apixaban (ELIQUIS) tablet 5 mg    Antimicrobials:  None Fluid: None  Consultants: Pulmonology Family Communication:  Not at bedside  Status is: Inpatient  Remains inpatient appropriate because:Ongoing diagnostic testing needed not appropriate for outpatient work up and IV treatments appropriate due to intensity of illness or inability to take PO   Dispo: The patient is from: Home              Anticipated d/c is to: Home              Anticipated d/c date is: Hopefully tomorrow if bleeding stabilized.              Patient currently is not medically stable to d/c.  Infusions:    Scheduled Meds: . apixaban  5  mg Oral BID  . cholecalciferol  2,000 Units Oral Daily  . guaiFENesin  1,200 mg Oral BID  . levothyroxine  250 mcg Oral Q0600  . metoprolol tartrate  12.5 mg Oral BID  . nicotine  14 mg Transdermal Daily  . vitamin B-12  500 mcg Oral Daily    Antimicrobials: Anti-infectives (From admission, onward)   None      PRN meds: acetaminophen **OR** acetaminophen   Objective: Vitals:   10/10/19 2001 10/11/19 0534  BP: 115/68 121/74  Pulse: 86 80  Resp: 18 16  Temp: 100.3 F (37.9 C) 98.2 F (36.8 C)  SpO2: 94% 94%    Intake/Output Summary (Last 24 hours) at 10/11/2019 1147 Last data filed  at 10/11/2019 0852 Gross per 24 hour  Intake 1392.05 ml  Output 300 ml  Net 1092.05 ml   Filed Weights   10/09/19 1021  Weight: 65.8 kg   Weight change:  Body mass index is 19.67 kg/m.   Physical Exam: General exam: Appears calm and comfortable.  Not in physical distress Skin: No rashes, lesions or ulcers. HEENT: Atraumatic, normocephalic, supple neck, no obvious bleeding Lungs: Clear to auscultation bilaterally. CVS: Regular rate and rhythm, no murmur GI/Abd soft, nontender, nondistended, bowel sound present CNS: Alert, awake, oriented x3 Psychiatry: Mood appropriate Extremities: No pedal edema, no calf tenderness  Data Review: I have personally reviewed the laboratory data and studies available.  Recent Labs  Lab 10/09/19 1046 10/10/19 0118 10/11/19 0311  WBC 12.0* 11.4* 12.4*  NEUTROABS 10.3*  --   --   HGB 13.7 12.7* 12.9*  HCT 40.8 36.4* 37.3*  MCV 92.5 91.9 91.6  PLT 420* 396 419*   Recent Labs  Lab 10/09/19 1046 10/09/19 2215  NA 133*  --   K 3.9  --   CL 93*  --   CO2 27  --   GLUCOSE 143*  --   BUN 15  --   CREATININE 0.99  --   CALCIUM 9.1  --   MG  --  2.1  PHOS  --  2.9    Signed, Lorin Glass, MD [facility] Pager: [phone] (Secure Chat preferred). 10/11/2019

## 2019-10-11 NOTE — Progress Notes (Signed)
ANTICOAGULATION CONSULT NOTE  Pharmacy Consult for Heparin Indication: pulmonary embolus  No Known Allergies  Patient Measurements: Height: 6' (182.9 cm) Weight: 65.8 kg (145 lb) IBW/kg (Calculated) : 77.6 Heparin Dosing Weight: 65.8  Vital Signs: Temp: 100.3 F (37.9 C) (07/18 2001) Temp Source: Oral (07/18 2001) BP: 115/68 (07/18 2001) Pulse Rate: 86 (07/18 2001)  Labs: Recent Labs     0000 10/09/19 1046 10/09/19 1140 10/09/19 2215 10/10/19 0118 10/10/19 0644 10/10/19 1328 10/10/19 2140 10/11/19 0311  HGB   < > 13.7  --   --  12.7*  --   --   --  12.9*  HCT  --  40.8  --   --  36.4*  --   --   --  37.3*  PLT  --  420*  --   --  396  --   --   --  419*  APTT  --   --   --    < >  --    < > 34 52* 50*  LABPROT  --   --  17.5*  --   --   --   --   --   --   INR  --   --  1.5*  --   --   --   --   --   --   HEPARINUNFRC  --   --   --   --   --    < > 1.40* 1.32* 1.12*  CREATININE  --  0.99  --   --   --   --   --   --   --    < > = values in this interval not displayed.    Estimated Creatinine Clearance: 66.5 mL/min (by C-G formula based on SCr of 0.99 mg/dL).   Assessment: 69 yo male with recent CT confirmed occlusive right sided PE and non occlusive left sided PE with heart strain on 7/7. Treated with IV heparin and discharged on apixaban.  Returned to ED 7/17 with progressive posterior right upper lobe pulmonary infarct with some cavitation and hemoptysis. Last dose of apixaban 7/17 at 0700. Likely effecting heparin levels so will continue to utilize PTT for monitoring until levels correlating.   PTT 50 sec (no real movement with heparin increase last pm). Heparin level remains elevated 1.12 (elevated due to apixaban). No issues with line or bleeding reported per RN.  Goal of Therapy:  aPTT 66-120 seconds; heparin level 0.3-0.7 units/ml Monitor platelets by anticoagulation protocol: Yes   Plan:  Rebolus heparin 2000 units Increase heparin infusion to 2150  units/hr F/u 6 hr PTT  Christoper Fabian, PharmD, BCPS Please see amion for complete clinical pharmacist phone list 10/11/2019 4:59 AM

## 2019-10-11 NOTE — Progress Notes (Signed)
NAME:  Chad Meyer, MRN:  [id number], DOB:  May 26, 1950, LOS: 2 ADMISSION DATE:  10/09/2019, CONSULTATION DATE: 10/09/2019 REFERRING MD: Dr. Jeraldine Loots, ED, CHIEF COMPLAINT: Hemoptysis, abnormal CT chest  Brief History   69 year old man with a history of hypothyroidism, osteoarthritis, tobacco use (cigars).  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  He was having some mild hemoptysis, sputum mixed with some red at the time of that diagnosis.  Discharged on Eliquis.  Returns now with an interval increase in his hemoptysis over the last several days although he has not seen any today.  Describes this as blood mixed in with clear sputum.  He gauges that he has produced approximately a cup of this kind of sputum a day for the last several days.  His dyspnea has improved significantly.  No chest pain.  Denies any fever, purulent mucus, sweats, etc.  Past Medical History   Past Medical History:  Diagnosis Date  . Arthritis   . Hypothyroidism   . Thyroid disease   Pulmonary embolism 09/29/19   Significant Hospital Events     Consults:  PCCM  Procedures:    Significant Diagnostic Tests:  CT chest 09/29/2019 >> bilateral pulmonary emboli and segmental arteries posterior right upper lobe, left lower lobe, right lower lobe with patchy posterior right upper lobe infiltrate consistent with infarcted lung.  Echocardiogram 09/30/19 >> LVEF 55-60%, grade 1 diastolic dysfunction, normal RV size and function, unable to assess PA pressure  CT chest 10/09/2019 >> no new PE, some recanalization of his posterior right upper lobe pulmonary artery that was previously fully occluded, decreased clot burden in both the right lower lobe and the left lower lobe.  Significant change in area of posterior right upper lobe consolidation now with a thick-walled cavitary component.  Also some mild hazy right lower lobe changes that are new, consistent with possible  mild infarct in this region as well.  Micro Data:    Antimicrobials:    Interim history/subjective:   Minimal blood, some rusty sputum and a few more solid bits of dark blood seen in the last 24 hours.  Definitely less than before.  Objective   Blood pressure 121/74, pulse 80, temperature 98.2 F (36.8 C), temperature source Oral, resp. rate 16, height 6' (1.829 m), weight 65.8 kg, SpO2 94 %.        Intake/Output Summary (Last 24 hours) at 10/11/2019 1119 Last data filed at 10/11/2019 5449 Gross per 24 hour  Intake 1392.05 ml  Output 300 ml  Net 1092.05 ml   Filed Weights   10/09/19 1021  Weight: 65.8 kg    Examination: General: Thin gentleman, laying in bed in no distress HENT: No blood in the oropharynx or the nasopharynx Lungs: Clear bilaterally, no crackles, no wheezing Cardiovascular: Regular, distant, no murmur Abdomen: Nondistended, positive bowel sounds Extremities: No lower extremity edema, no cords or calf tenderness Neuro: Awake, alert, appropriate, nonfocal  Resolved Hospital Problem list     Assessment & Plan:   Hemoptysis due to pulmonary emboli and progressive posterior right upper lobe pulmonary infarct with some cavitation. No clinical evidence for infection to support that this cavity represents abscess, pneumonia.  He was having some low-grade hemoptysis even at his original diagnosis so unclear whether the increase in bleeding relates to the cavity formation versus the initiation of his anticoagulation.  Regardless, because his infarct evolved and he is developing cavitation he is at risk for worsening hemoptysis, even  massive hemoptysis.  Thus far bleeding has been stable, even decreased on heparin infusion.  This is reassuring.. -I think it is okay to try changing the heparin back to Eliquis today 7/19 to see if he remained stable, see if bleeding increases.  If he tolerates then we can probably consider discharge back on his usual Eliquis 7/20 -If  bleeding increases such that we do not feel comfortable with anticoagulation of any kind then we would have to consider IVC filter placement -Would repeat his chest x-ray on 7/22 assess his evolving right upper lobe cavity -Continue to watch for fever, purulent sputum, or any other signs of evolving infection   Labs   CBC: Recent Labs  Lab 10/09/19 1046 10/10/19 0118 10/11/19 0311  WBC 12.0* 11.4* 12.4*  NEUTROABS 10.3*  --   --   HGB 13.7 12.7* 12.9*  HCT 40.8 36.4* 37.3*  MCV 92.5 91.9 91.6  PLT 420* 396 419*    Basic Metabolic Panel: Recent Labs  Lab 10/09/19 1046 10/09/19 2215  NA 133*  --   K 3.9  --   CL 93*  --   CO2 27  --   GLUCOSE 143*  --   BUN 15  --   CREATININE 0.99  --   CALCIUM 9.1  --   MG  --  2.1  PHOS  --  2.9   GFR: Estimated Creatinine Clearance: 66.5 mL/min (by C-G formula based on SCr of 0.99 mg/dL). Recent Labs  Lab 10/09/19 1046 10/09/19 1812 10/10/19 0118 10/11/19 0311  PROCALCITON  --  0.12  --   --   WBC 12.0*  --  11.4* 12.4*    Liver Function Tests: Recent Labs  Lab 10/09/19 1046  AST 63*  ALT 82*  ALKPHOS 77  BILITOT 0.5  PROT 7.6  ALBUMIN 2.4*     Critical care time: N/A     Levy Pupa, MD, PhD 10/11/2019, 11:19 AM [facility] [phone] or if no answer [phone]

## 2019-10-12 ENCOUNTER — Inpatient Hospital Stay (HOSPITAL_COMMUNITY): Payer: No Typology Code available for payment source

## 2019-10-12 DIAGNOSIS — R042 Hemoptysis: Secondary | ICD-10-CM | POA: Diagnosis not present

## 2019-10-12 DIAGNOSIS — I2699 Other pulmonary embolism without acute cor pulmonale: Secondary | ICD-10-CM

## 2019-10-12 LAB — CBC
HCT: 37.1 % — ABNORMAL LOW (ref 39.0–52.0)
Hemoglobin: 12.9 g/dL — ABNORMAL LOW (ref 13.0–17.0)
MCH: 31.9 pg (ref 26.0–34.0)
MCHC: 34.8 g/dL (ref 30.0–36.0)
MCV: 91.6 fL (ref 80.0–100.0)
Platelets: 415 10*3/uL — ABNORMAL HIGH (ref 150–400)
RBC: 4.05 MIL/uL — ABNORMAL LOW (ref 4.22–5.81)
RDW: 14.8 % (ref 11.5–15.5)
WBC: 12.9 10*3/uL — ABNORMAL HIGH (ref 4.0–10.5)
nRBC: 0 % (ref 0.0–0.2)

## 2019-10-12 LAB — APTT: aPTT: 31 seconds (ref 24–36)

## 2019-10-12 MED ORDER — BENZONATATE 100 MG PO CAPS
200.0000 mg | ORAL_CAPSULE | Freq: Three times a day (TID) | ORAL | Status: DC
  Administered 2019-10-12 – 2019-10-13 (×5): 200 mg via ORAL
  Filled 2019-10-12 (×5): qty 2

## 2019-10-12 MED ORDER — BENZONATATE 100 MG PO CAPS: 200.0000 mg | ORAL_CAPSULE | Freq: Three times a day (TID) | ORAL | Status: DC

## 2019-10-12 MED ORDER — BISACODYL 5 MG PO TBEC: 5.0000 mg | DELAYED_RELEASE_TABLET | Freq: Every day | ORAL | Status: DC | PRN

## 2019-10-12 MED ORDER — FLEET ENEMA 7-19 GM/118ML RE ENEM: 1.0000 | ENEMA | Freq: Every day | RECTAL | Status: DC | PRN

## 2019-10-12 NOTE — Progress Notes (Signed)
NAME:  Chad Meyer, MRN:  [id number], DOB:  1950/10/21, LOS: 3 ADMISSION DATE:  10/09/2019, CONSULTATION DATE: 10/09/2019 REFERRING MD: Dr. Jeraldine Loots, ED, CHIEF COMPLAINT: Hemoptysis, abnormal CT chest  Brief History   69 year old man with a history of hypothyroidism, osteoarthritis, tobacco use (cigars).  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  He was having some mild hemoptysis, sputum mixed with some red at the time of that diagnosis.  Discharged on Eliquis.  Returns now with an interval increase in his hemoptysis over the last several days although he has not seen any today.  Describes this as blood mixed in with clear sputum.  He gauges that he has produced approximately a cup of this kind of sputum a day for the last several days.  His dyspnea has improved significantly.  No chest pain.  Denies any fever, purulent mucus, sweats, etc.  Past Medical History   Past Medical History:  Diagnosis Date   Arthritis    Hypothyroidism    Thyroid disease   Pulmonary embolism 09/29/19   Significant Hospital Events   7/17 admitted, on heparin gtt 7/19 - Minimal blood, some rusty sputum and a few more solid bits of dark blood seen in the last 24 hours.  Definitely less than before.  Heparin gtt d/c-> Restarted on eliquis  Consults:  PCCM  Procedures:    Significant Diagnostic Tests:  CT chest 09/29/2019 >> bilateral pulmonary emboli and segmental arteries posterior right upper lobe, left lower lobe, right lower lobe with patchy posterior right upper lobe infiltrate consistent with infarcted lung.  Echocardiogram 09/30/19 >> LVEF 55-60%, grade 1 diastolic dysfunction, normal RV size and function, unable to assess PA pressure  CT chest 10/09/2019 >> no new PE, some recanalization of his posterior right upper lobe pulmonary artery that was previously fully occluded, decreased clot burden in both the right lower lobe and the left lower  lobe.  Significant change in area of posterior right upper lobe consolidation now with a thick-walled cavitary component.  Also some mild hazy right lower lobe changes that are new, consistent with possible mild infarct in this region as well.  Micro Data:  none  Antimicrobials:  none  Interim history/subjective:  tmax 100.7, WBC 12.4-> 12.9 Hgb stable Remains on room air Overall reports his sputum production is improving, still rust/ dark red at times and then clear mucous at others.  Denies SOB C/o of increased coughing episodes this morning and  hot/ cold episodes overnight (which he states is not new for him, started with his initial PE)    Objective   Blood pressure 114/75, pulse 83, temperature 98.5 F (36.9 C), temperature source Oral, resp. rate 17, height 6' (1.829 m), weight 65.8 kg, SpO2 98 %.        Intake/Output Summary (Last 24 hours) at 10/12/2019 0840 Last data filed at 10/12/2019 4259 Gross per 24 hour  Intake 1250.67 ml  Output 1375 ml  Net -124.33 ml   Filed Weights   10/09/19 1021  Weight: 65.8 kg    Examination: General:  Pleasant thin older male lying in bed in no distress HEENT: MM pink/moist Neuro: A/O, appropiate, MAE CV: rr PULM:  Non labored, speaking full sentences, non productive coughing spells during my exam, CTA- no wheezing GI: soft, NT,  bs active  Extremities: warm/dry, no LE edema  Skin: no rashes   Resolved Hospital Problem list     Assessment & Plan:  Hemoptysis due to pulmonary emboli and progressive posterior right upper lobe pulmonary infarct with some cavitation formation.  - Continue to watch for fever, purulent sputum, or any other signs of evolving infection - sputum remains unchanged, slightly better, CXR 7/20 stable   - trend CXR - tessalon for cough suppression - restarted on Eliquis 7/19 with no worsening hemoptysis, still remains high risk for massive hemoptysis  -If bleeding increases such that we do not feel  comfortable with anticoagulation of any kind then we would have to consider IVC filter placement  PCCM will continue to follow along   Labs   CBC: Recent Labs  Lab 10/09/19 1046 10/10/19 0118 10/11/19 0311 10/12/19 0314  WBC 12.0* 11.4* 12.4* 12.9*  NEUTROABS 10.3*  --   --   --   HGB 13.7 12.7* 12.9* 12.9*  HCT 40.8 36.4* 37.3* 37.1*  MCV 92.5 91.9 91.6 91.6  PLT 420* 396 419* 415*    Basic Metabolic Panel: Recent Labs  Lab 10/09/19 1046 10/09/19 2215  NA 133*  --   K 3.9  --   CL 93*  --   CO2 27  --   GLUCOSE 143*  --   BUN 15  --   CREATININE 0.99  --   CALCIUM 9.1  --   MG  --  2.1  PHOS  --  2.9   GFR: Estimated Creatinine Clearance: 66.5 mL/min (by C-G formula based on SCr of 0.99 mg/dL). Recent Labs  Lab 10/09/19 1046 10/09/19 1812 10/10/19 0118 10/11/19 0311 10/12/19 0314  PROCALCITON  --  0.12  --   --   --   WBC 12.0*  --  11.4* 12.4* 12.9*    Liver Function Tests: Recent Labs  Lab 10/09/19 1046  AST 63*  ALT 82*  ALKPHOS 77  BILITOT 0.5  PROT 7.6  ALBUMIN 2.4*     Critical care time: N/A     Posey Boyer, MSN, AGACNP-BC [facility] 10/12/2019, 8:41 AM  See Amion for personal pager PCCM on call pager [phone]

## 2019-10-12 NOTE — Discharge Instructions (Signed)
Information on my medicine - ELIQUIS (apixaban)  This medication education was reviewed with me or my healthcare representative as part of my discharge preparation.    Why was Eliquis prescribed for you? Eliquis was prescribed to treat blood clots that may have been found in your lungs (pulmonary embolism) and to reduce the risk of them occurring again.  What do You need to know about Eliquis ? The dose is ONE 5 mg tablet taken TWICE daily.  Eliquis may be taken with or without food.   Try to take the dose about the same time in the morning and in the evening. If you have difficulty swallowing the tablet whole please discuss with your pharmacist how to take the medication safely.  Take Eliquis exactly as prescribed and DO NOT stop taking Eliquis without talking to the doctor who prescribed the medication.  Stopping may increase your risk of developing a new blood clot.  Refill your prescription before you run out.  After discharge, you should have regular check-up appointments with your healthcare provider that is prescribing your Eliquis.    What do you do if you miss a dose? If a dose of ELIQUIS is not taken at the scheduled time, take it as soon as possible on the same day and twice-daily administration should be resumed. The dose should not be doubled to make up for a missed dose.  Important Safety Information A possible side effect of Eliquis is bleeding. You should call your healthcare provider right away if you experience any of the following: ? Bleeding from an injury or your nose that does not stop. ? Unusual colored urine (red or dark brown) or unusual colored stools (red or black). ? Unusual bruising for unknown reasons. ? A serious fall or if you hit your head (even if there is no bleeding).  Some medicines may interact with Eliquis and might increase your risk of bleeding or clotting while on Eliquis. To help avoid this, consult your healthcare provider or  pharmacist prior to using any new prescription or non-prescription medications, including herbals, vitamins, non-steroidal anti-inflammatory drugs (NSAIDs) and supplements.  This website has more information on Eliquis (apixaban): http://www.eliquis.com/eliquis/home

## 2019-10-12 NOTE — Progress Notes (Signed)
PROGRESS NOTE  Chad Meyer  DOB: 17-Feb-1951  PCP: Patient, No Pcp Per [facility]:[id number]  DOA: 10/09/2019  LOS: 3 days   Chief Complaint  Patient presents with  . Hemoptysis    coughing up blood   Brief narrative: Chad Meyer is a 69 y.o. male with medical history significant of recently diagnosed PE with right heart strain, hypothyroidism, alcohol abuse and tobacco abuse. Patient presented to ED on 7/17 with hemoptysis.  7/7, CTA chest showed PE on right lung, echo showed right heart strain with RV:LV=1:1.  He was started on Eliquis and discharged home. Since then, patient has had occasional coughing up blood clots, small amount intermittent, denies any chest pain or shortness of breath, no fever, chills.  2 days ago,  patient started coughing up blood clots which became more frequent and severe.  On the day of admission, patient coughed up about 1 cup / 12 ounce of blood clots.  He then came to ED for evaluation.  In the ED, CTA chest showed right upper lung necrosis with blood clot burden improvement of the right side lung.   No hypoxia or tachycardia, WBC 12, hemoglobin 13.7.  Subjective: Patient was seen and examined this morning.   Not in distress.  Continues to have cough with episodes of hemoptysis.  I started him on Tessalon Perles this morning. Patient again had an episode of fever 100.7.  WBC count slightly up to 12.9 this morning. Hemoglobin stable at 12.9. Chest x-ray this morning is stable.  Assessment/Plan: Hemoptysis Recently started anticoagulation for pulmonary embolism -Hemoptysis likely from pulmonary necrosis of the right upper lung which showed up on the repeat CT scan of chest.   -Pulmonary consult appreciated.  Patient was placed on heparin drip. -Hemoptysis eventually stabilized.  Hemoglobin stable.  On 7/19, he was switched from heparin drip back to Eliquis again.  Continues to have intermittent episodes of rusty blood-tinged sputum.  Hemoglobin  stable.  Low-grade fever Leukocytosis -For last 48 hours, patient had 2 episodes of temperature of 100.5 both times at night.  WC count slightly up at 12.9. Likely secondary to necrotic tissue absorption in the lungs.   -No clear aerobronchogram on CT, check procalcitonin.  Currently not on antibiotics. -Continue to monitor.  Hypoalbuminemia -Albumin level low at 2.4.  Liver ultrasound normal.    Tobacco abuse -Continue Nicotine patch.  Mobility: Encourage ambulation Code Status:   Code Status: Full Code  Nutritional status: Body mass index is 19.67 kg/m.     Diet Order            Diet regular Room service appropriate? Yes; Fluid consistency: Thin  Diet effective now                 DVT prophylaxis:  apixaban (ELIQUIS) tablet 5 mg    Antimicrobials:  None Fluid: None  Consultants: Pulmonology Family Communication:  Not at bedside  Status is: Inpatient  Remains inpatient appropriate because:Ongoing diagnostic testing needed not appropriate for outpatient work up and IV treatments appropriate due to intensity of illness or inability to take PO   Dispo: The patient is from: Home              Anticipated d/c is to: Home              Anticipated d/c date is: Hopefully tomorrow if bleeding stabilized.              Patient currently is not medically stable to d/c.  Infusions:  Scheduled Meds: . apixaban  5 mg Oral BID  . benzonatate  200 mg Oral TID  . cholecalciferol  2,000 Units Oral Daily  . guaiFENesin  1,200 mg Oral BID  . levothyroxine  250 mcg Oral Q0600  . metoprolol tartrate  12.5 mg Oral BID  . nicotine  14 mg Transdermal Daily  . polyethylene glycol  17 g Oral Daily  . vitamin B-12  500 mcg Oral Daily    Antimicrobials: Anti-infectives (From admission, onward)   None      PRN meds: acetaminophen **OR** acetaminophen, bisacodyl, sodium phosphate   Objective: Vitals:   10/11/19 2100 10/12/19 0551  BP: 127/66 114/75  Pulse: 86 83   Resp: 19 17  Temp: (!) 100.7 F (38.2 C) 98.5 F (36.9 C)  SpO2: 99% 98%    Intake/Output Summary (Last 24 hours) at 10/12/2019 1312 Last data filed at 10/12/2019 1219 Gross per 24 hour  Intake 890.67 ml  Output 1450 ml  Net -559.33 ml   Filed Weights   10/09/19 1021  Weight: 65.8 kg   Weight change:  Body mass index is 19.67 kg/m.   Physical Exam: General exam: Appears calm and comfortable.  Not in physical distress Skin: No rashes, lesions or ulcers. HEENT: Atraumatic, normocephalic, supple neck, no obvious bleeding Lungs: Clear to auscultation bilaterally.  No wheezing or crackles. CVS: Regular rate and rhythm, no murmur GI/Abd soft, nontender, nondistended, bowel sound present CNS: Alert, awake, oriented x3 Psychiatry: Mood appropriate Extremities: No pedal edema, no calf tenderness  Data Review: I have personally reviewed the laboratory data and studies available.  Recent Labs  Lab 10/09/19 1046 10/10/19 0118 10/11/19 0311 10/12/19 0314  WBC 12.0* 11.4* 12.4* 12.9*  NEUTROABS 10.3*  --   --   --   HGB 13.7 12.7* 12.9* 12.9*  HCT 40.8 36.4* 37.3* 37.1*  MCV 92.5 91.9 91.6 91.6  PLT 420* 396 419* 415*   Recent Labs  Lab 10/09/19 1046 10/09/19 2215  NA 133*  --   K 3.9  --   CL 93*  --   CO2 27  --   GLUCOSE 143*  --   BUN 15  --   CREATININE 0.99  --   CALCIUM 9.1  --   MG  --  2.1  PHOS  --  2.9    Signed, Lorin Glass, MD [facility] Pager: [phone] (Secure Chat preferred). 10/12/2019

## 2019-10-13 DIAGNOSIS — R042 Hemoptysis: Secondary | ICD-10-CM | POA: Diagnosis not present

## 2019-10-13 LAB — CBC
HCT: 36.7 % — ABNORMAL LOW (ref 39.0–52.0)
Hemoglobin: 12.4 g/dL — ABNORMAL LOW (ref 13.0–17.0)
MCH: 30.8 pg (ref 26.0–34.0)
MCHC: 33.8 g/dL (ref 30.0–36.0)
MCV: 91.3 fL (ref 80.0–100.0)
Platelets: 417 10*3/uL — ABNORMAL HIGH (ref 150–400)
RBC: 4.02 MIL/uL — ABNORMAL LOW (ref 4.22–5.81)
RDW: 14.6 % (ref 11.5–15.5)
WBC: 13 10*3/uL — ABNORMAL HIGH (ref 4.0–10.5)
nRBC: 0 % (ref 0.0–0.2)

## 2019-10-13 LAB — APTT: aPTT: 31 seconds (ref 24–36)

## 2019-10-13 MED ORDER — BENZONATATE 200 MG PO CAPS: 200.0000 mg | ORAL_CAPSULE | Freq: Three times a day (TID) | ORAL | 0 refills | Status: DC

## 2019-10-13 MED ORDER — APIXABAN 5 MG PO TABS: 5.0000 mg | ORAL_TABLET | Freq: Two times a day (BID) | ORAL | Status: DC

## 2019-10-13 MED ORDER — CEFDINIR 300 MG PO CAPS
300.0000 mg | ORAL_CAPSULE | Freq: Two times a day (BID) | ORAL | 0 refills | Status: AC
Start: 2019-10-13 — End: 2019-10-18

## 2019-10-13 MED ORDER — BENZONATATE 200 MG PO CAPS: 200.0000 mg | ORAL_CAPSULE | Freq: Three times a day (TID) | ORAL | 0 refills | Status: AC

## 2019-10-13 MED ORDER — APIXABAN 5 MG PO TABS: 5.0000 mg | ORAL_TABLET | Freq: Two times a day (BID) | ORAL | Status: AC

## 2019-10-13 MED ORDER — CEFDINIR 300 MG PO CAPS
300.0000 mg | ORAL_CAPSULE | Freq: Two times a day (BID) | ORAL | 0 refills | Status: DC
Start: 2019-10-13 — End: 2019-10-13

## 2019-10-13 NOTE — Discharge Summary (Addendum)
Physician Discharge Summary  Chad Meyer ZOX:[id number] DOB: December 14, 1950 DOA: 10/09/2019  PCP: Patient, No Pcp Per  Admit date: 10/09/2019 Discharge date: 10/13/2019  Admitted From: Home Discharge disposition: Home   Code Status: Full Code  Diet Recommendation: Regular diet   Recommendations for Outpatient Follow-Up:   1. Follow-up with PCP as an outpatient 2. Follow-up with pulmonology as an outpatient if needed.  Discharge Diagnosis:   Active Problems:   Cough with hemoptysis   Pulmonary infarct (HCC)   History of Present Illness / Brief narrative:  Chad Meyer a 69 y.o.malewith medical history significant ofrecently diagnosed PE with right heart strain, hypothyroidism, alcohol abuse and tobacco abuse. Patient presented to ED on 7/17 with hemoptysis. 7/7, CTA chest showed PE on right lung, echo showed right heart strain with RV:LV=1:1.  He was started on Eliquis and discharged home. Since then, patient has had occasional coughing upblood clots, small amount intermittent, denies any chest pain or shortness of breath, no fever, chills.  2 days ago, patient started coughing up blood clots which became more frequent and severe.  On the day of admission, patient coughed up about 1 cup / 12 ounce of blood clots.  He then came to ED for evaluation.  In the ED, CTA chest showed right upper lung necrosis withblood clotburden improvement of the right side lung. No hypoxia or tachycardia, WBC 12,hemoglobin 13.7.  Hospital Course:  Hemoptysis Recently started anticoagulation for pulmonary embolism -Hemoptysis likely from pulmonary necrosis of the right upper lung which showed up on the repeat CT scan of chest.  -Pulmonary consult appreciated.  Patient was initially switched from Eliquis to heparin drip.  Hemoptysis eventually stabilized.  Hemoglobin stable.  On 7/19, he was switched back to Eliquis again.    No more vaginal bleeding.  Hemoglobin has remained stable 12.     Low-grade fever Leukocytosis -For last 72 hours, patient is having single episode of temperature of 101 each night.  WBC count is slightly elevated up to 13.  Likely secondary to necrotic tissue absorption in the lungs.   -No clearaerobronchogramon CT, checkprocalcitonin.  Currently not on antibiotics. -At discharge today, I decided to start the patient on oral cefdinir for 5 days.  Hypoalbuminemia -Albumin level low at 2.4.  Liver ultrasound normal.    Tobacco abuse -Continue Nicotine patch.  Wound care:    Subjective:  Seen and examined this morning.  Elderly African-American male.  Lying down in bed.  Not in distress.  No new symptoms.  Not coughing up any more.  Discharge Exam:   Vitals:   10/12/19 1833 10/12/19 2033 10/13/19 0513 10/13/19 1346  BP: 125/69 117/72 122/77 123/68  Pulse: 91 78 79 75  Resp: 18 18 18 20   Temp: (!) 101.1 F (38.4 C) 99.6 F (37.6 C) 98.4 F (36.9 C) 99.3 F (37.4 C)  TempSrc: Oral Oral Oral Oral  SpO2: 97% 98% 98% 99%  Weight:      Height:        Body mass index is 19.67 kg/m.  General exam: Appears calm and comfortable.  Skin: No rashes, lesions or ulcers. HEENT: Atraumatic, normocephalic, supple neck, no obvious bleeding Lungs: Clear to auscultation bilaterally CVS: Regular rate and rhythm, no murmur GI/Abd soft, nontender, nondistended, bowel sound present CNS: Alert, awake, oriented x3 Psychiatry: Mood appropriate Extremities: No pedal edema, no calf tenderness  Discharge Instructions:   Discharge Instructions    Diet general   Complete by: As directed    Increase activity  slowly   Complete by: As directed       Follow-up Information    [facility] Follow up.   Contact information: [address] [place] [address] [phone]       Leslye Peer, MD Follow up.   Specialty: Pulmonary Disease Contact information: [address] [place] [address] [phone]        Glenford Bayley, NP Follow up on 10/27/2019.   Specialty: Pulmonary Disease Why: 10am  Contact information: [address] [place] [address] [phone]              Allergies as of 10/13/2019   No Known Allergies     Medication List    STOP taking these medications   Apixaban Starter Pack (10mg  and 5mg ) Commonly known as: ELIQUIS STARTER PACK Replaced by: apixaban 5 MG Tabs tablet   aspirin EC 81 MG tablet   nicotine 14 mg/24hr patch Commonly known as: NICODERM CQ - dosed in mg/24 hours     TAKE these medications   apixaban 5 MG Tabs tablet Commonly known as: ELIQUIS Take 1 tablet (5 mg total) by mouth 2 (two) times daily. Replaces: Apixaban Starter Pack (10mg  and 5mg )   benzonatate 200 MG capsule Commonly known as: TESSALON Take 1 capsule (200 mg total) by mouth 3 (three) times daily for 7 days.   cefdinir 300 MG capsule Commonly known as: OMNICEF Take 1 capsule (300 mg total) by mouth 2 (two) times daily for 5 days.   levothyroxine 125 MCG tablet Commonly known as: SYNTHROID Take 250 mcg by mouth daily before breakfast.   sildenafil 100 MG tablet Commonly known as: VIAGRA Take 50 mg by mouth daily as needed for erectile dysfunction.   vitamin B-12 500 MCG tablet Commonly known as: CYANOCOBALAMIN Take 500 mcg by mouth daily.   Vitamin D3 50 MCG (2000 UT) Tabs Take 2,000 Units by mouth daily.       Time coordinating discharge: 35 minutes  The results of significant diagnostics from this hospitalization (including imaging, microbiology, ancillary and laboratory) are listed below for reference.    Procedures and Diagnostic Studies:   CT Angio Chest PE W/Cm &/Or Wo Cm  Result Date: 10/09/2019 CLINICAL DATA:  69 year old with history of pulmonary embolism. Shortness of breath. Hemoptysis. EXAM: CT ANGIOGRAPHY CHEST WITH CONTRAST TECHNIQUE: Multidetector CT imaging of the chest was  performed using the standard protocol during bolus administration of intravenous contrast. Multiplanar CT image reconstructions and MIPs were obtained to evaluate the vascular anatomy. CONTRAST:  25mL OMNIPAQUE IOHEXOL 350 MG/ML SOLN COMPARISON:  09/29/2019 FINDINGS: Cardiovascular: Again noted are right-sided pulmonary emboli. There is thrombus in a segmental branch of the right upper lobe on sequence 7, image 108. There is partial recanalization of this vessel compared to the previous examination. Decreased clot burden in the right lower lobe pulmonary arteries. There is still a small amount of submental pulmonary emboli in the right lower lobe. Markedly decreased clot burden in the left lower lobe segmental branches. Evidence for minimal residual clot in the left lower lobe. Main pulmonary arteries are widely patent. Heart size is stable. No significant pericardial fluid. Atherosclerotic calcifications involving the abdominal aorta. Coronary artery calcifications. Proximal descending thoracic aorta measures 3.2 cm and stable. Mediastinum/Nodes: Mediastinal structures are unremarkable without significant lymph node enlargement. No significant thyroid tissue is appreciated. Lungs/Pleura: Trachea and mainstem bronchi are patent. Parenchymal disease in the posterior right  upper lobe corresponds with the large segmental pulmonary embolism in the right upper lobe branch. The consolidation in the posterior right upper lobe has progressed and now there is a large amount of gas within this area of consolidation. Findings are most compatible with pulmonary necrosis secondary to a pulmonary infarct. Hazy patchy opacities in the right lower lobe particularly in the superior segment of the right lower lobe. Disease in the right lower lobe may have slightly progressed. No significant airspace disease or consolidation in the left lung. No large pleural effusions. Upper Abdomen: Visualized upper abdominal structures are within  normal limits. Musculoskeletal: No acute bone abnormality. Review of the MIP images confirms the above findings. IMPRESSION: 1. Progressive consolidation in the posterior right upper lobe with evidence of necrosis. This area of necrosis and pulmonary infarct corresponds with a large segmental pulmonary embolism from the right upper lobe pulmonary artery. Pulmonary necrosis likely accounts for the patient's hemoptysis. 2. Pulmonary embolism clot burden has markedly decreased. Minimal clot remaining in the left lung. There is residual clot in the posterior right upper lobe corresponding with the necrotic/cavitary lung disease. 3. Slightly increased parenchymal disease in the right lower lobe is nonspecific but could represent a developing infectious or inflammatory process. Electronically Signed   By: Richarda Overlie M.D.   On: 10/09/2019 14:28   DG Chest Port 1 View  Result Date: 10/09/2019 CLINICAL DATA:  Chest pain and shortness of breath. Hemoptysis. Patient diagnosed with pulmonary emboli 09/29/2019. EXAM: PORTABLE CHEST 1 VIEW COMPARISON:  CT chest and single view of the chest scratch the CT chest and PA and lateral chest 09/29/2019. FINDINGS: Hazy airspace opacity in the right upper lung zone persists. There appears to be new cavitation present. The left lung is clear. No pneumothorax or pleural effusion. Heart size is normal. Atherosclerosis noted. IMPRESSION: Persistent hazy opacity in the right upper lobe with likely new cavitation at the site of pulmonary infarct on prior CT. Electronically Signed   By: Drusilla Kanner M.D.   On: 10/09/2019 12:10   US Abdomen Limited RUQ  Result Date: 10/09/2019 CLINICAL DATA:  Cirrhosis. EXAM: ULTRASOUND ABDOMEN LIMITED RIGHT UPPER QUADRANT COMPARISON:  None. FINDINGS: Gallbladder: No gallstones or wall thickening visualized. No sonographic Murphy sign noted by sonographer. Common bile duct: Diameter: 0.2 cm. Liver: No focal lesion identified. Within normal limits in  parenchymal echogenicity. Portal vein is patent on color Doppler imaging with normal direction of blood flow towards the liver. Other: None. IMPRESSION: No sonographic abnormality identified in the liver. Electronically Signed   By: Emmaline Kluver M.D.   On: 10/09/2019 16:03     Labs:   Basic Metabolic Panel: Recent Labs  Lab 10/09/19 1046 10/09/19 2215  NA 133*  --   K 3.9  --   CL 93*  --   CO2 27  --   GLUCOSE 143*  --   BUN 15  --   CREATININE 0.99  --   CALCIUM 9.1  --   MG  --  2.1  PHOS  --  2.9   GFR Estimated Creatinine Clearance: 66.5 mL/min (by C-G formula based on SCr of 0.99 mg/dL). Liver Function Tests: Recent Labs  Lab 10/09/19 1046  AST 63*  ALT 82*  ALKPHOS 77  BILITOT 0.5  PROT 7.6  ALBUMIN 2.4*   No results for input(s): LIPASE, AMYLASE in the last 168 hours. No results for input(s): AMMONIA in the last 168 hours. Coagulation profile Recent Labs  Lab 10/09/19 1140  INR 1.5*    CBC: Recent Labs  Lab 10/09/19 1046 10/10/19 0118 10/11/19 0311 10/12/19 0314 10/13/19 0222  WBC 12.0* 11.4* 12.4* 12.9* 13.0*  NEUTROABS 10.3*  --   --   --   --   HGB 13.7 12.7* 12.9* 12.9* 12.4*  HCT 40.8 36.4* 37.3* 37.1* 36.7*  MCV 92.5 91.9 91.6 91.6 91.3  PLT 420* 396 419* 415* 417*   Cardiac Enzymes: No results for input(s): CKTOTAL, CKMB, CKMBINDEX, TROPONINI in the last 168 hours. BNP: Invalid input(s): POCBNP CBG: No results for input(s): GLUCAP in the last 168 hours. D-Dimer No results for input(s): DDIMER in the last 72 hours. Hgb A1c No results for input(s): HGBA1C in the last 72 hours. Lipid Profile No results for input(s): CHOL, HDL, LDLCALC, TRIG, CHOLHDL, LDLDIRECT in the last 72 hours. Thyroid function studies No results for input(s): TSH, T4TOTAL, T3FREE, THYROIDAB in the last 72 hours.  Invalid input(s): FREET3 Anemia work up No results for input(s): VITAMINB12, FOLATE, FERRITIN, TIBC, IRON, RETICCTPCT in the last 72  hours. Microbiology No results found for this or any previous visit (from the past 240 hour(s)).  Please note: You were cared for by a hospitalist during your hospital stay. Once you are discharged, your primary care physician will handle any further medical issues. Please note that NO REFILLS for any discharge medications will be authorized once you are discharged, as it is imperative that you return to your primary care physician (or establish a relationship with a primary care physician if you do not have one) for your post hospital discharge needs so that they can reassess your need for medications and monitor your lab values.  Signed: Lorin Glass  [facility] 10/13/2019, 2:54 PM

## 2019-10-13 NOTE — Progress Notes (Signed)
Chad Meyer to be D/C'd per MD order. Discussed with the patient and all questions fully answered. ? VSS, Skin clean, dry and intact without evidence of skin break down, no evidence of skin tears noted. ? IV catheter discontinued intact. Site without signs and symptoms of complications. Dressing and pressure applied. ? An After Visit Summary was printed and given to the patient. Patient informed where to pickup prescriptions. ? D/c education completed with patient/family including follow up instructions, medication list, d/c activities limitations if indicated, with other d/c instructions as indicated by MD - patient able to verbalize understanding, all questions fully answered.  ? Patient instructed to return to ED, call 911, or call MD for any changes in condition.  ? Patient to be escorted via WC, and D/C home via private auto.

## 2019-10-27 ENCOUNTER — Ambulatory Visit (INDEPENDENT_AMBULATORY_CARE_PROVIDER_SITE_OTHER): Payer: No Typology Code available for payment source

## 2019-10-27 ENCOUNTER — Encounter: Payer: Self-pay | Admitting: Primary Care

## 2019-10-27 ENCOUNTER — Other Ambulatory Visit: Payer: Self-pay

## 2019-10-27 ENCOUNTER — Ambulatory Visit (INDEPENDENT_AMBULATORY_CARE_PROVIDER_SITE_OTHER): Payer: No Typology Code available for payment source | Admitting: Primary Care

## 2019-10-27 ENCOUNTER — Telehealth: Payer: Self-pay | Admitting: Primary Care

## 2019-10-27 VITALS — BP 122/72 | HR 116 | Temp 97.6°F | Ht 72.0 in | Wt 138.4 lb

## 2019-10-27 DIAGNOSIS — I2699 Other pulmonary embolism without acute cor pulmonale: Secondary | ICD-10-CM

## 2019-10-27 DIAGNOSIS — R042 Hemoptysis: Secondary | ICD-10-CM

## 2019-10-27 LAB — CBC
HCT: 40 % (ref 39.0–52.0)
Hemoglobin: 13.4 g/dL (ref 13.0–17.0)
MCHC: 33.5 g/dL (ref 30.0–36.0)
MCV: 93.2 fl (ref 78.0–100.0)
Platelets: 414 10*3/uL — ABNORMAL HIGH (ref 150.0–400.0)
RBC: 4.3 Mil/uL (ref 4.22–5.81)
RDW: 15 % (ref 11.5–15.5)
WBC: 8.9 10*3/uL (ref 4.0–10.5)

## 2019-10-27 LAB — BASIC METABOLIC PANEL
BUN: 10 mg/dL (ref 6–23)
CO2: 32 mEq/L (ref 19–32)
Calcium: 10.3 mg/dL (ref 8.4–10.5)
Chloride: 96 mEq/L (ref 96–112)
Creatinine, Ser: 0.84 mg/dL (ref 0.40–1.50)
GFR: 109.89 mL/min (ref >60.00)
Glucose, Bld: 115 mg/dL — ABNORMAL HIGH (ref 70–99)
Potassium: 4.1 mEq/L (ref 3.5–5.1)
Sodium: 135 mEq/L (ref 135–145)

## 2019-10-27 MED ORDER — BENZONATATE 100 MG PO CAPS: 200.0000 mg | ORAL_CAPSULE | Freq: Three times a day (TID) | ORAL | 3 refills | Status: AC | PRN

## 2019-10-27 NOTE — Progress Notes (Addendum)
@Patient  ID: Chad Meyer, male    DOB: 1950/04/15, 69 y.o.   MRN: [id number]  Chief Complaint  Patient presents with  . Hospitalization Follow-up    Referring provider: No ref. provider found  HPI: 69 year old male, current tobacco use.  Past medical history significant for pulmonary embolism, hypothyroidism, osteoarthritis, tobacco use.    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 Echocardiogram showed right heart strain with RV:LV 1:1. He was discharged on Eliquis.  He returned to the hospital on 10/09/2019 with interval increase in hemoptysis over the last several days.  Pulmonary consulted, Dr. Delton CoombesByrum. Hemoptysis due to pulmonary emboli, now progressive posterior right upper lobe pulmonary infarct with some cavitation.  No clinical evidence for infection although WBC 12.  Patient was initially switched from Eliquis to heparin.  Hemoptysis eventually stabilized.  Hemoglobin stable.  On 7/19 he was switched back to Eliquis. Plan to continue this unless bleeding restarts, if large scale hemoptysis then may need IVC filter and d/c anticoagulation.  Patient had low-grade fevers with leukocytosis.  Patient was discharged on 5-day course of cefdinir. Tessalon for cough suppression. Trend CXR.   10/27/2019 Patient presents today for a 2-week hospital follow-up for PE/hemoptysis. Reports right sided chest pain and headaches. He mostly feels ok. He will get short of breath on exertion. He is still coughing up blood. Currently on Eliquis 5mg  twice daily. He is not taking aspirin or NSAID. He takes tessalon perles as needed, states that they make him fall asleep. He completed cefdinir. He initially felt some better and hemoptysis has semi resolved. At that time he was coughing up brown mucus. No known fever. He does report some intermittent night sweats. CXR today showed Persistent cavitary lesion with air-fluid level in RIGHT upper lobe  either representing pulmonary infarct with necrosis or lung abscess. Denies N/V/D.   Imaging: 09/29/2019 CT chest-bilateral pulmonary emboli with segmental arteries posterior right upper lobe, left lower lobe, right lower lobe with patchy posterior right upper lobe infiltrate consistent with infarcted lung  10/09/2019-no new PE, some recannulization of his posterior right upper lobe pulmonary artery that was previously fully occluded, decreased clot burden in both the right lower lobe and the left lower lobe.  Significant change in area of posterior right upper lobe consolidation now with a thick-walled cavitary component.  Mild hazy right lower lobe changes that are new, consistent with possible mild infarct in this region as well.  Cardiac testing: 09/30/2019 echocardiogram-left ventricular ejection fraction 55-60%, grade 1 diastolic dysfunction, normal RV size and function, unable to assess PA pressure  No Known Allergies  Immunization History  Administered Date(s) Administered  . Moderna SARS-COVID-2 Vaccination 07/21/2019, 08/18/2019    Past Medical History:  Diagnosis Date  . Arthritis   . Hypothyroidism   . Thyroid disease     Tobacco History: Social History   Tobacco Use  Smoking Status Former Smoker  . Types: Cigars  Smokeless Tobacco Never Used   Counseling given: Not Answered   Outpatient Medications Prior to Visit  Medication Sig Dispense Refill  . apixaban (ELIQUIS) 5 MG TABS tablet Take 1 tablet (5 mg total) by mouth 2 (two) times daily. 60 tablet   . Cholecalciferol (VITAMIN D3) 50 MCG (2000 UT) TABS Take 2,000 Units by mouth daily.    Marland Kitchen. levothyroxine (SYNTHROID) 125 MCG tablet Take 250 mcg by mouth daily before breakfast.    . sildenafil (VIAGRA) 100 MG tablet Take 50 mg  by mouth daily as needed for erectile dysfunction.    . vitamin B-12 (CYANOCOBALAMIN) 500 MCG tablet Take 500 mcg by mouth daily.    . benzonatate (TESSALON) 100 MG capsule Take 200 mg by mouth 3  (three) times daily as needed for cough.     No facility-administered medications prior to visit.    Review of Systems  Review of Systems  Constitutional: Positive for diaphoresis. Negative for fever.  Respiratory: Positive for cough and shortness of breath.     Physical Exam  BP 122/72 (BP Location: Left Arm, Cuff Size: Normal)   Pulse (!) 116   Temp 97.6 F (36.4 C) (Oral)   Ht 6' (1.829 m)   Wt 138 lb 6.4 oz (62.8 kg)   SpO2 96%   BMI 18.77 kg/m  Physical Exam Constitutional:      General: He is not in acute distress.    Appearance: Normal appearance.     Comments: Thin male  Cardiovascular:     Rate and Rhythm: Regular rhythm. Tachycardia present.  Pulmonary:     Effort: Pulmonary effort is normal. No respiratory distress.     Breath sounds: Normal breath sounds. No wheezing.     Comments: CTA Neurological:     General: No focal deficit present.     Mental Status: He is alert and oriented to person, place, and time. Mental status is at baseline.  Psychiatric:        Mood and Affect: Mood normal.        Behavior: Behavior normal.        Thought Content: Thought content normal.        Judgment: Judgment normal.        Lab Results:  CBC    Component Value Date/Time   WBC 8.9 10/27/2019 1016   RBC 4.30 10/27/2019 1016   HGB 13.4 10/27/2019 1016   HCT 40.0 10/27/2019 1016   PLT 414.0 (H) 10/27/2019 1016   MCV 93.2 10/27/2019 1016   MCH 30.8 10/13/2019 0222   MCHC 33.5 10/27/2019 1016   RDW 15.0 10/27/2019 1016   LYMPHSABS 0.6 (L) 10/09/2019 1046   MONOABS 1.0 10/09/2019 1046   EOSABS 0.1 10/09/2019 1046   BASOSABS 0.1 10/09/2019 1046    BMET    Component Value Date/Time   NA 135 10/27/2019 1016   K 4.1 10/27/2019 1016   CL 96 10/27/2019 1016   CO2 32 10/27/2019 1016   GLUCOSE 115 (H) 10/27/2019 1016   BUN 10 10/27/2019 1016   CREATININE 0.84 10/27/2019 1016   CALCIUM 10.3 10/27/2019 1016   GFRNONAA >60 10/09/2019 1046   GFRAA >60  10/09/2019 1046    BNP No results found for: BNP  ProBNP No results found for: PROBNP  Imaging: DG Chest 2 View  Result Date: 10/27/2019 CLINICAL DATA:  Pulmonary infarct, follow-up EXAM: CHEST - 2 VIEW COMPARISON:  10/12/2019 FINDINGS: Normal heart size, mediastinal contours, and pulmonary vascularity. Peribronchial thickening with again identified cavitary lesion in the RIGHT upper lobe 7.2 x 6.5 x 4.7 cm consistent with abscess or infarct with necrosis. No underlying mass in the RIGHT upper lobe with seen on an earlier CT exam from 09/29/2019 to suggest cavitary neoplasm. Persistent visualization of a small air-fluid level within this collection. Remaining lungs clear. No pleural effusion or pneumothorax. IMPRESSION: Persistent cavitary lesion with air-fluid level in RIGHT upper lobe either representing pulmonary infarct with necrosis or lung abscess. Electronically Signed   By: Angelyn Punt.D.  On: 10/27/2019 09:36   DG Chest 2 View  Addendum Date: 09/29/2019   ADDENDUM REPORT: 09/29/2019 10:20 ADDENDUM: With a more focal opacity in the RIGHT upper lobe infection is favored. Suggest follow-up to ensure resolution. These results were called by telephone at the time of interpretation on 09/29/2019 at 10:20 am to provider [facility] , who verbally acknowledged these results. Electronically Signed   By: Donzetta Kohut M.D.   On: 09/29/2019 10:20   Result Date: 09/29/2019 CLINICAL DATA:  RIGHT lateral chest pain soreness and shortness of breath with productive cough for 3-4 days EXAM: CHEST - 2 VIEW COMPARISON:  None FINDINGS: Trachea is midline. Cardiomediastinal contours are normal. Mild fullness of RIGHT and LEFT hila. Inter stool markings are increased bilaterally with hazy density in the RIGHT upper chest. No lobar consolidation. No sign of pleural effusion. Convex margin along the RIGHT heart border overlying the RIGHT hilum likely ectatic thoracic aorta. On limited assessment visualized  skeletal structures without acute process. IMPRESSION: Increased interstitial markings suggested throughout without signs of edema or effusion. Hazy density in the RIGHT upper chest as described. Findings raise the question of background interstitial disease or though could be seen with atypical infection or asymmetric edema. Aortic ectasia. Electronically Signed: By: Donzetta Kohut M.D. On: 09/29/2019 09:37   CT Angio Chest PE W/Cm &/Or Wo Cm  Result Date: 10/09/2019 CLINICAL DATA:  69 year old with history of pulmonary embolism. Shortness of breath. Hemoptysis. EXAM: CT ANGIOGRAPHY CHEST WITH CONTRAST TECHNIQUE: Multidetector CT imaging of the chest was performed using the standard protocol during bolus administration of intravenous contrast. Multiplanar CT image reconstructions and MIPs were obtained to evaluate the vascular anatomy. CONTRAST:  37mL OMNIPAQUE IOHEXOL 350 MG/ML SOLN COMPARISON:  09/29/2019 FINDINGS: Cardiovascular: Again noted are right-sided pulmonary emboli. There is thrombus in a segmental branch of the right upper lobe on sequence 7, image 108. There is partial recanalization of this vessel compared to the previous examination. Decreased clot burden in the right lower lobe pulmonary arteries. There is still a small amount of submental pulmonary emboli in the right lower lobe. Markedly decreased clot burden in the left lower lobe segmental branches. Evidence for minimal residual clot in the left lower lobe. Main pulmonary arteries are widely patent. Heart size is stable. No significant pericardial fluid. Atherosclerotic calcifications involving the abdominal aorta. Coronary artery calcifications. Proximal descending thoracic aorta measures 3.2 cm and stable. Mediastinum/Nodes: Mediastinal structures are unremarkable without significant lymph node enlargement. No significant thyroid tissue is appreciated. Lungs/Pleura: Trachea and mainstem bronchi are patent. Parenchymal disease in the  posterior right upper lobe corresponds with the large segmental pulmonary embolism in the right upper lobe branch. The consolidation in the posterior right upper lobe has progressed and now there is a large amount of gas within this area of consolidation. Findings are most compatible with pulmonary necrosis secondary to a pulmonary infarct. Hazy patchy opacities in the right lower lobe particularly in the superior segment of the right lower lobe. Disease in the right lower lobe may have slightly progressed. No significant airspace disease or consolidation in the left lung. No large pleural effusions. Upper Abdomen: Visualized upper abdominal structures are within normal limits. Musculoskeletal: No acute bone abnormality. Review of the MIP images confirms the above findings. IMPRESSION: 1. Progressive consolidation in the posterior right upper lobe with evidence of necrosis. This area of necrosis and pulmonary infarct corresponds with a large segmental pulmonary embolism from the right upper lobe pulmonary artery. Pulmonary necrosis likely accounts  for the patient's hemoptysis. 2. Pulmonary embolism clot burden has markedly decreased. Minimal clot remaining in the left lung. There is residual clot in the posterior right upper lobe corresponding with the necrotic/cavitary lung disease. 3. Slightly increased parenchymal disease in the right lower lobe is nonspecific but could represent a developing infectious or inflammatory process. Electronically Signed   By: Richarda Overlie M.D.   On: 10/09/2019 14:28   CT Angio Chest PE W and/or Wo Contrast  Result Date: 09/29/2019 CLINICAL DATA:  Shortness of breath and chest pain. EXAM: CT ANGIOGRAPHY CHEST WITH CONTRAST TECHNIQUE: Multidetector CT imaging of the chest was performed using the standard protocol during bolus administration of intravenous contrast. Multiplanar CT image reconstructions and MIPs were obtained to evaluate the vascular anatomy. CONTRAST:  OMNIPAQUE  IOHEXOL 350 MG/ML SOLN COMPARISON:  None. FINDINGS: Cardiovascular: The heart size is normal. No substantial pericardial effusion. Coronary artery calcification is evident. Atherosclerotic calcification is noted in the wall of the thoracic aorta. Occlusive pulmonary embolus identified in segmental pulmonary arteries to the posterior right upper lobe. Nonocclusive filling defect identified in the left lower lobe pulmonary artery extending into segmental and subsegmental branches of the left lower lobe. Segmental and subsegmental pulmonary embolus is identified in the right lower lobe as well. Mediastinum/Nodes: No mediastinal lymphadenopathy. There is no hilar lymphadenopathy. The esophagus has normal imaging features. There is no axillary lymphadenopathy. Lungs/Pleura: Interstitial and patchy airspace disease is identified posterior right upper lobe compatible with infarct. Atelectasis noted in the lung bases. No pleural effusion. Upper Abdomen: Unremarkable. Musculoskeletal: No worrisome lytic or sclerotic osseous abnormality. Review of the MIP images confirms the above findings. IMPRESSION: 1. Occlusive pulmonary embolus identified in segmental branches to the posterior right upper lobe with associated posterior right upper lobe pulmonary infarct. Nonocclusive pulmonary embolus in the left lower lobe pulmonary artery extending into segmental and subsegmental branches of the left lower lobe. Segmental and subsegmental pulmonary embolus identified in the right lower lobe, as well. CTevidence of right heart strain (RV/LV Ratio = 1.1) consistent with at least submassive (intermediate risk) PE. The presence of right heart strain has been associated with an increased risk of morbidity and mortality. 2. Aortic Atherosclerosis (ICD10-I70.0). Critical Value/emergent results were called by me at the time of interpretation on 09/29/2019 at 7:03 pm to provider [facility] , who verbally acknowledged these results. Electronically  Signed   By: Kennith Center M.D.   On: 09/29/2019 19:04   DG Chest Port 1 View  Result Date: 10/12/2019 CLINICAL DATA:  Pulmonary infarct. EXAM: PORTABLE CHEST 1 VIEW COMPARISON:  10/09/2019 FINDINGS: The cardiac silhouette, mediastinal and hilar contours are within normal limits and stable. Stable large cavitary area in the right upper lobe. The left lung remains clear. No pleural effusions. IMPRESSION: Stable large right upper lobe cavitary area. Electronically Signed   By: Rudie Meyer M.D.   On: 10/12/2019 09:13   DG Chest Port 1 View  Result Date: 10/09/2019 CLINICAL DATA:  Chest pain and shortness of breath. Hemoptysis. Patient diagnosed with pulmonary emboli 09/29/2019. EXAM: PORTABLE CHEST 1 VIEW COMPARISON:  CT chest and single view of the chest scratch the CT chest and PA and lateral chest 09/29/2019. FINDINGS: Hazy airspace opacity in the right upper lung zone persists. There appears to be new cavitation present. The left lung is clear. No pneumothorax or pleural effusion. Heart size is normal. Atherosclerosis noted. IMPRESSION: Persistent hazy opacity in the right upper lobe with likely new cavitation at the site  of pulmonary infarct on prior CT. Electronically Signed   By: Drusilla Kanner M.D.   On: 10/09/2019 12:10   ECHOCARDIOGRAM COMPLETE  Result Date: 10/04/2019    ECHOCARDIOGRAM REPORT   Patient Name:   Jameel Thornell Date of Exam: 09/30/2019 Medical Rec #:  [id number]     Height:       72.0 in Accession #:    [id number]    Weight:       155.0 lb Date of Birth:  [birth year]     BSA:          1.912 m Patient Age:    69 years      BP:           124/70 mmHg Patient Gender: M             HR:           84 bpm. Exam Location:  Inpatient Procedure: 2D Echo STAT ECHO Indications:     pulmonary embolus 415.19  History:         Patient has no prior history of Echocardiogram examinations.  Sonographer:     Delcie Roch Referring Phys:  [id number] Carlton Adam Diagnosing Phys: Marca Ancona MD  IMPRESSIONS  1. Left ventricular ejection fraction, by estimation, is 55 to 60%. The left ventricle has normal function. The left ventricle has no regional wall motion abnormalities. Left ventricular diastolic parameters are consistent with Grade I diastolic dysfunction (impaired relaxation).  2. Right ventricular systolic function is normal. The right ventricular size is normal. Tricuspid regurgitation signal is inadequate for assessing PA pressure.  3. The mitral valve is normal in structure. No evidence of mitral valve regurgitation. No evidence of mitral stenosis.  4. The aortic valve is tricuspid. Aortic valve regurgitation is not visualized. Mild aortic valve sclerosis is present, with no evidence of aortic valve stenosis.  5. The inferior vena cava is normal in size with greater than 50% respiratory variability, suggesting right atrial pressure of 3 mmHg. FINDINGS  Left Ventricle: Left ventricular ejection fraction, by estimation, is 55 to 60%. The left ventricle has normal function. The left ventricle has no regional wall motion abnormalities. The left ventricular internal cavity size was normal in size. There is  no left ventricular hypertrophy. Left ventricular diastolic parameters are consistent with Grade I diastolic dysfunction (impaired relaxation). Right Ventricle: The right ventricular size is normal. No increase in right ventricular wall thickness. Right ventricular systolic function is normal. Tricuspid regurgitation signal is inadequate for assessing PA pressure. Left Atrium: Left atrial size was normal in size. Right Atrium: Right atrial size was normal in size. Pericardium: There is no evidence of pericardial effusion. Mitral Valve: The mitral valve is normal in structure. No evidence of mitral valve regurgitation. No evidence of mitral valve stenosis. Tricuspid Valve: The tricuspid valve is normal in structure. Tricuspid valve regurgitation is not demonstrated. Aortic Valve: The aortic valve is  tricuspid. Aortic valve regurgitation is not visualized. Mild aortic valve sclerosis is present, with no evidence of aortic valve stenosis. Pulmonic Valve: The pulmonic valve was normal in structure. Pulmonic valve regurgitation is trivial. Aorta: The aortic root is normal in size and structure. Venous: The inferior vena cava is normal in size with greater than 50% respiratory variability, suggesting right atrial pressure of 3 mmHg. IAS/Shunts: No atrial level shunt detected by color flow Doppler.  LEFT VENTRICLE PLAX 2D LVIDd:         4.30 cm  Diastology LVIDs:  2.60 cm  LV e' lateral:   10.70 cm/s LV PW:         1.00 cm  LV E/e' lateral: 6.3 LV IVS:        0.80 cm  LV e' medial:    9.79 cm/s LVOT diam:     2.10 cm  LV E/e' medial:  6.9 LV SV:         68 LV SV Index:   36 LVOT Area:     3.46 cm  RIGHT VENTRICLE             IVC RV S prime:     14.70 cm/s  IVC diam: 1.40 cm TAPSE (M-mode): 2.1 cm LEFT ATRIUM             Index       RIGHT ATRIUM           Index LA diam:        2.80 cm 1.46 cm/m  RA Area:     10.40 cm LA Vol (A2C):   45.5 ml 23.80 ml/m RA Volume:   23.20 ml  12.13 ml/m LA Vol (A4C):   46.8 ml 24.48 ml/m LA Biplane Vol: 49.2 ml 25.73 ml/m  AORTIC VALVE LVOT Vmax:   108.00 cm/s LVOT Vmean:  68.400 cm/s LVOT VTI:    0.197 m  AORTA Ao Root diam: 3.10 cm Ao Asc diam:  2.90 cm MITRAL VALVE MV Area (PHT): 2.73 cm    SHUNTS MV Decel Time: 278 msec    Systemic VTI:  0.20 m MV E velocity: 67.30 cm/s  Systemic Diam: 2.10 cm MV A velocity: 82.70 cm/s MV E/A ratio:  0.81 Marca Ancona MD Electronically signed by Marca Ancona MD Signature Date/Time: 09/30/2019/10:19:24 AM    Final (Updated)    US Abdomen Limited RUQ  Result Date: 10/09/2019 CLINICAL DATA:  Cirrhosis. EXAM: ULTRASOUND ABDOMEN LIMITED RIGHT UPPER QUADRANT COMPARISON:  None. FINDINGS: Gallbladder: No gallstones or wall thickening visualized. No sonographic Murphy sign noted by sonographer. Common bile duct: Diameter: 0.2 cm. Liver: No  focal lesion identified. Within normal limits in parenchymal echogenicity. Portal vein is patent on color Doppler imaging with normal direction of blood flow towards the liver. Other: None. IMPRESSION: No sonographic abnormality identified in the liver. Electronically Signed   By: Emmaline Kluver M.D.   On: 10/09/2019 16:03     Assessment & Plan:   10 yar old male recently hospitalized for PE/pulmonary infarct. 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 He was readmitted on 10/07/19 for hemoptysis on blood thinner. Eliquis was initially held and he was put on heparin drip. Bleeding resolved and he was transitioned back to Eliquis. He continues to cough up blood. He has chest pain on right side, headaches. CXR today showed Persistent cavitary lesion with air-fluid level in RIGHT upper lobe either representing pulmonary infarct with necrosis or lung abscess. He was having low grade fevers and leukocytosis in the hospital, discharged on 5 day course of cefdinir. Some initial improvement hemoptysis after completing antibiotic. He has had no fevers but reports night sweats.   Cough with hemoptysis - Patient continues to have hemoptysis several times a day. He initially had some improvement after completing cefdinir  - Spoke with Dr. Delton Coombes, he would prefer to keep him on anticoagulation vs IVC filter. Plan hold Eliquis for 2 days. Repeat CT chest to better evaluate cavitation area. May need course of  Augmentin if concern for abscess vs possible referral for resection of necrotic lung   Glenford Bayley, NP 10/28/2019

## 2019-10-27 NOTE — Telephone Encounter (Signed)
Yes - may need to consider augmentin if we believes he could have abscess

## 2019-10-27 NOTE — Patient Instructions (Addendum)
Recommendations: - Hold Eliquis tonight and until further notice - You can take tylenol 650mg  three times a day  - Continue Tessalon perles three times a day for cough suppression - I will discuss with Dr. - may need to refer you to interventional radiology for IVC filter placement for PE. Also may want to repeat CT imaging to better evaluate cavitary lesion. We will touch base with you once I speak with him  Orders: - Labs today  Follow-up: - 2 weeks with Dr. Delton Coombes or APP in office     Pulmonary Embolism  A pulmonary embolism (PE) is a sudden blockage or decrease of blood flow in one or both lungs. Most blockages come from a blood clot that forms in the vein of a lower leg, thigh, or arm (deep vein thrombosis, DVT) and travels to the lungs. A clot is blood that has thickened into a gel or solid. PE is a dangerous and life-threatening condition that needs to be treated right away. What are the causes? This condition is usually caused by a blood clot that forms in a vein and moves to the lungs. In rare cases, it may be caused by air, fat, part of a tumor, or other tissue that moves through the veins and into the lungs. What increases the risk? The following factors may make you more likely to develop this condition:  Experiencing a traumatic injury, such as breaking a hip or leg.  Having: ? A spinal cord injury. ? Orthopedic surgery, especially hip or knee replacement. ? Any major surgery. ? A stroke. ? DVT. ? Blood clots or blood clotting disease. ? Long-term (chronic) lung or heart disease. ? Cancer treated with chemotherapy. ? A central venous catheter.  Taking medicines that contain estrogen. These include birth control pills and hormone replacement therapy.  Being: ? Pregnant. ? In the period of time after your baby is delivered (postpartum). ? Older than age 49. ? Overweight. ? A smoker, especially if you have other risks. What are the signs or symptoms? Symptoms  of this condition usually start suddenly and include:  Shortness of breath during activity or at rest.  Coughing, coughing up blood, or coughing up blood-tinged mucus.  Chest pain that is often worse with deep breaths.  Rapid or irregular heartbeat.  Feeling light-headed or dizzy.  Fainting.  Feeling anxious.  Fever.  Sweating.  Pain and swelling in a leg. This is a symptom of DVT, which can lead to PE. How is this diagnosed? This condition may be diagnosed based on:  Your medical history.  A physical exam.  Blood tests.  CT pulmonary angiogram. This test checks blood flow in and around your lungs.  Ventilation-perfusion scan, also called a lung VQ scan. This test measures air flow and blood flow to the lungs.  An ultrasound of the legs. How is this treated? Treatment for this condition depends on many factors, such as the cause of your PE, your risk for bleeding or developing more clots, and other medical conditions you have. Treatment aims to remove, dissolve, or stop blood clots from forming or growing larger. Treatment may include:  Medicines, such as: ? Blood thinning medicines (anticoagulants) to stop clots from forming. ? Medicines that dissolve clots (thrombolytics).  Procedures, such as: ? Using a flexible tube to remove a blood clot (embolectomy) or to deliver medicine to destroy it (catheter-directed thrombolysis). ? Inserting a filter into a large vein that carries blood to the heart (inferior vena cava). This filter (  vena cava filter) catches blood clots before they reach the lungs. ? Surgery to remove the clot (surgical embolectomy). This is rare. You may need a combination of immediate, long-term (up to 3 months after diagnosis), and extended (more than 3 months after diagnosis) treatments. Your treatment may continue for several months (maintenance therapy). You and your health care provider will work together to choose the treatment program that is best  for you. Follow these instructions at home: Medicines  Take over-the-counter and prescription medicines only as told by your health care provider.  If you are taking an anticoagulant medicine: ? Take the medicine every day at the same time each day. ? Understand what foods and drugs interact with your medicine. ? Understand the side effects of this medicine, including excessive bruising or bleeding. Ask your health care provider or pharmacist about other side effects. General instructions  Wear a medical alert bracelet or carry a medical alert card that says you have had a PE and lists what medicines you take.  Ask your health care provider when you may return to your normal activities. Avoid sitting or lying for a long time without moving.  Maintain a healthy weight. Ask your health care provider what weight is healthy for you.  Do not use any products that contain nicotine or tobacco, such as cigarettes, e-cigarettes, and chewing tobacco. If you need help quitting, ask your health care provider.  Talk with your health care provider about any travel plans. It is important to make sure that you are still able to take your medicine while on trips.  Keep all follow-up visits as told by your health care provider. This is important. Contact a health care provider if:  You missed a dose of your blood thinner medicine. Get help right away if:  You have: ? New or increased pain, swelling, warmth, or redness in an arm or leg. ? Numbness or tingling in an arm or leg. ? Shortness of breath during activity or at rest. ? A fever. ? Chest pain. ? A rapid or irregular heartbeat. ? A severe headache. ? Vision changes. ? A serious fall or accident, or you hit your head. ? Stomach (abdominal) pain. ? Blood in your vomit, stool, or urine. ? A cut that will not stop bleeding.  You cough up blood.  You feel light-headed or dizzy.  You cannot move your arms or legs.  You are confused or  have memory loss. These symptoms may represent a serious problem that is an emergency. Do not wait to see if the symptoms will go away. Get medical help right away. Call your local emergency services (911 in the [place] Do not drive yourself to the hospital. Summary  A pulmonary embolism (PE) is a sudden blockage or decrease of blood flow in one or both lungs. PE is a dangerous and life-threatening condition that needs to be treated right away.  Treatments for this condition usually include medicines to thin your blood (anticoagulants) or medicines to break apart blood clots (thrombolytics).  If you are given blood thinners, it is important to take the medicine every day at the same time each day.  Understand what foods and drugs interact with any medicines that you are taking.  If you have signs of PE or DVT, call your local emergency services (911 in the [place] This information is not intended to replace advice given to you by your health care provider. Make sure you discuss any questions you have with your health  care provider. Document Revised: 12/17/2017 Document Reviewed: 12/17/2017 Elsevier Patient Education  2020 [employer].

## 2019-10-27 NOTE — Telephone Encounter (Signed)
Dr. Delton Coombes-  Patient you saw in hospital for PE/pulmonary infarct. 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 He was readmitted on 10/07/19 for hemoptysis on blood thinner. Eliquis was initially held and he was put on heparin drip. Bleeding resolved and he was transitioned back to Eliquis.   He continues to cough up blood. He has chest pain on right side, headaches. CXR today showed Persistent cavitary lesion with air-fluid level in RIGHT upper lobe either representing pulmonary infarct with necrosis or lung abscess.  My plan is to hold Eliquis tonight. Do you want me to refer to IR for IVC filter placement???  Also do you want me to get CT imaging to better evaluate cavitary lesion? He was having low grade fevers and leukocytosis in the hospital, they discharged him on 5 day course of cefdinir. He has had no fevers but reports night sweats.   -Beth

## 2019-10-27 NOTE — Telephone Encounter (Signed)
I'd really like to keep him on anticoagulation after holding for 1-2 days if possible. Difficult to say how much blood we should tolerate before we stop it and go to IVC filter. I think it would be reasonable to repeat his CT to assess necrosing area. The CT appearance may influence what we do next - with the Eliquis, or possible referral for resection of necrotic lung. Thanks for seeing him.

## 2019-10-27 NOTE — Progress Notes (Signed)
Please let patient know his hgb was normal. His WBC is normal 8.9 (previously 13). Kidney function was normal.   Waiting to hear back from Dr. Leonard Schwartz

## 2019-10-27 NOTE — Progress Notes (Signed)
I spoke with the pt and notified of results per [facility] and he verbalized understanding.

## 2019-10-27 NOTE — Telephone Encounter (Signed)
OK-  thank you so much for your input. I will have him hold for 1-2 days and I will get CT chest. It sounds like he did benefit from the cefdinir he was sent home on for 5 days. May consider different abx course after CT imaging?

## 2019-10-27 NOTE — Telephone Encounter (Signed)
Will do, thanks. I'll keep you in the loop.

## 2019-10-28 NOTE — Telephone Encounter (Signed)
Pt calling office back-- he doesn't know why he was called-- I assume it was about this. Please return call

## 2019-10-28 NOTE — Telephone Encounter (Signed)
Good morning, Patient is returning a call and is not sure about what it is concerning.  Did you try to reach him regarding the recommendations from Dr. Delton Coombes?  Please advise if we need to call the patient and give any specific instructions.  Thank you.

## 2019-10-28 NOTE — Assessment & Plan Note (Addendum)
-   Patient continues to have hemoptysis several times a day. He initially had some improvement after completing cefdinir  - Spoke with Dr. Delton Coombes, he would prefer to keep him on anticoagulation vs IVC filter. Plan hold Eliquis for 2 days. Repeat CT chest to better evaluate cavitation area. May need course of Augmentin if concern for abscess vs possible referral for resection of necrotic lung

## 2019-10-28 NOTE — Telephone Encounter (Signed)
Nope, I spoke with him yesterday. May have been to scheduled CT chest.

## 2019-10-28 NOTE — Telephone Encounter (Signed)
I spoke to pt & gave him CT appt info.  I also explained to him a referral is needed from the [place] for his visits her and for him to get CT.  Rodell Perna is going to fill out paperwork and fax to [place] to request referral and she asked that I have the pt to call [facility] also.  Mr Currie stated he would call them when he gets off phone with me.  Nothing further needed at this time.

## 2019-10-28 NOTE — Telephone Encounter (Signed)
It looks like Cordelia Pen had reached out to this patient.

## 2019-10-28 NOTE — Telephone Encounter (Signed)
PCCs please advise if you had called this pt to schedule his CT.  It appears that it is scheduled for 8/12 but I see no other reason for our office calling this pt.  Thanks!

## 2019-11-04 ENCOUNTER — Inpatient Hospital Stay: Admission: RE | Admit: 2019-11-04 | Payer: No Typology Code available for payment source | Source: Ambulatory Visit

## 2019-11-10 ENCOUNTER — Ambulatory Visit: Payer: No Typology Code available for payment source | Admitting: Primary Care

## 2020-10-16 IMAGING — CT CT ANGIO CHEST
2 of 6 series · 18 of 46 positions shown · IV contrast (omnipaque)
Comparison: 09/29/2019

CLINICAL DATA: 69-year-old with history of pulmonary embolism.
Shortness of breath. Hemoptysis.

EXAM:
CT ANGIOGRAPHY CHEST WITH CONTRAST
TECHNIQUE: Multidetector CT imaging of the chest was performed using the
standard protocol during bolus administration of intravenous
contrast. Multiplanar CT image reconstructions and MIPs were
obtained to evaluate the vascular anatomy.
CONTRAST:  75mL OMNIPAQUE IOHEXOL 350 MG/ML SOLN

[Series 7: thins · axial · 0.70mm/px · z∈[+1189,+1439]mm · 15 of 274 slices shown]
[im 12/274  lung]
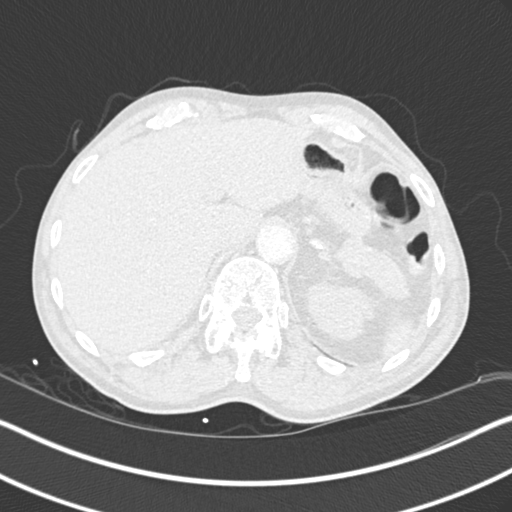
[im 36/274  soft-tissue]
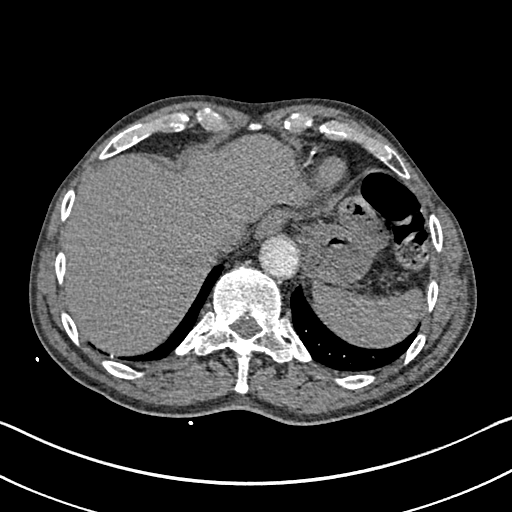
[im 48/274  lung]
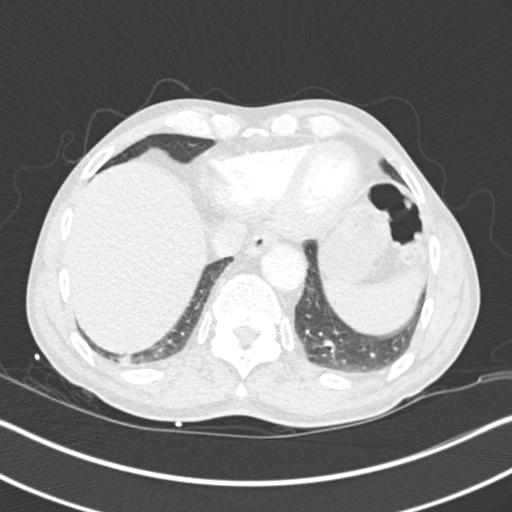
[im 72/274  soft-tissue]
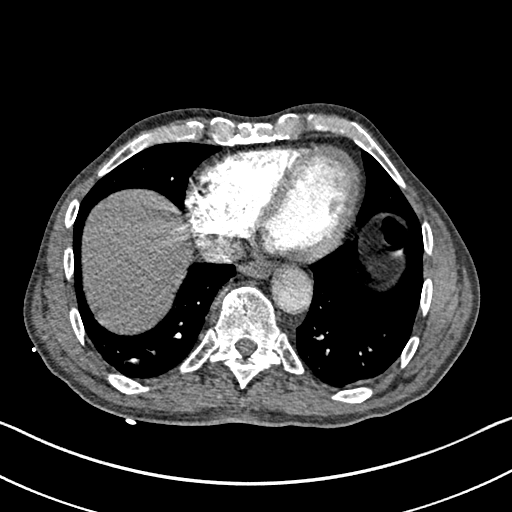
[im 84/274  lung]
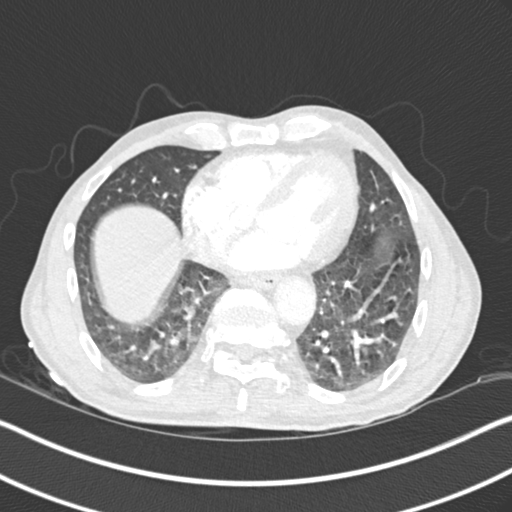
[im 107/274  soft-tissue]
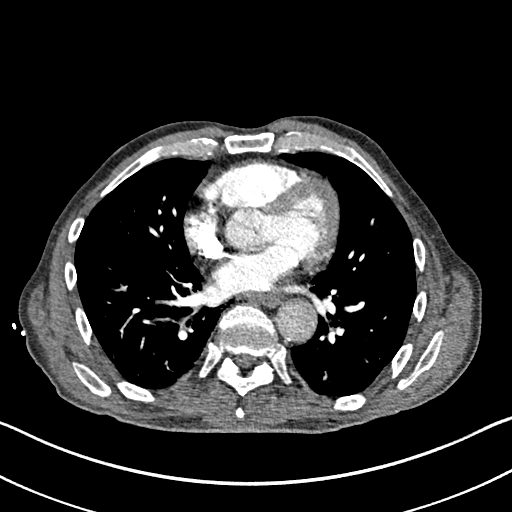
[im 119/274  lung]
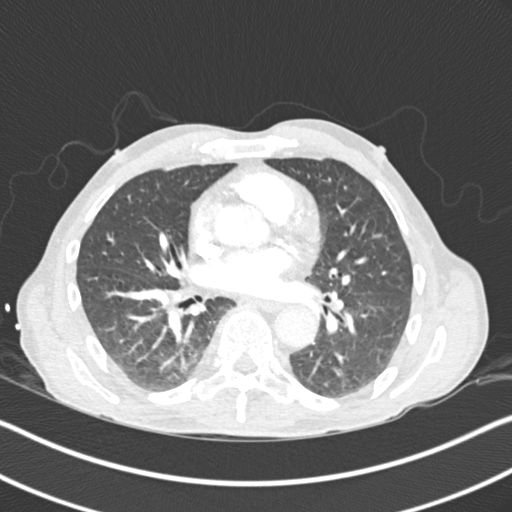
[im 143/274  soft-tissue]
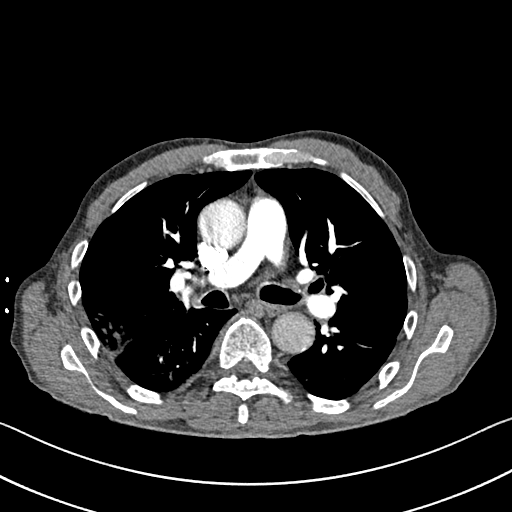
[im 155/274  lung]
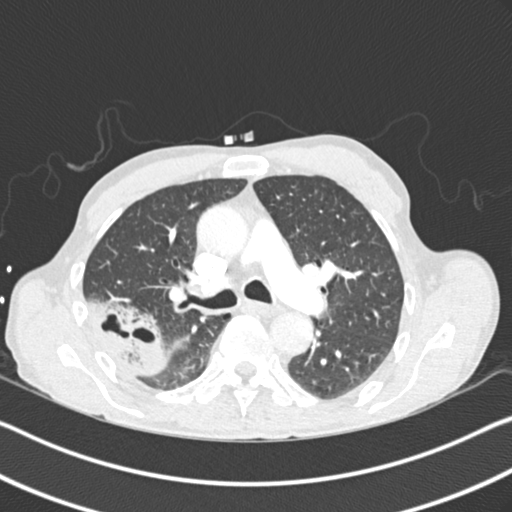
[im 167/274  soft-tissue]
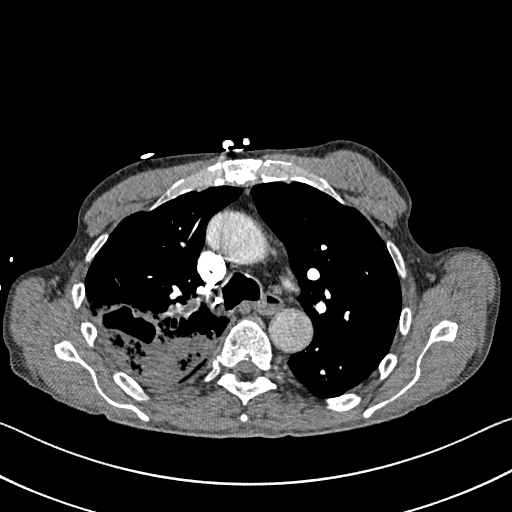
[im 190/274  lung]
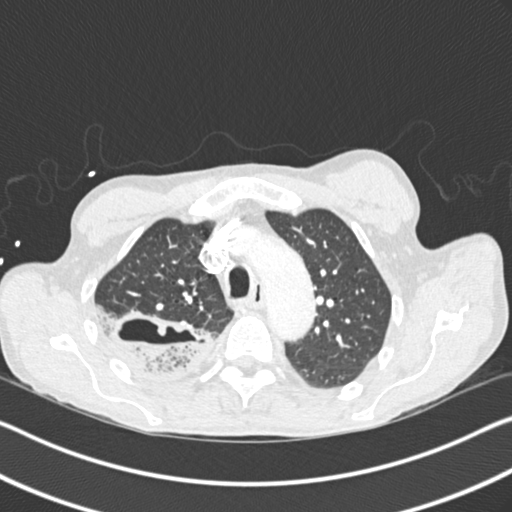
[im 202/274  soft-tissue]
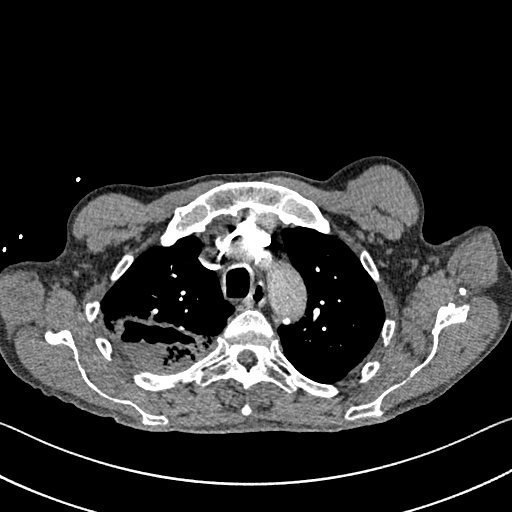
[im 226/274  lung]
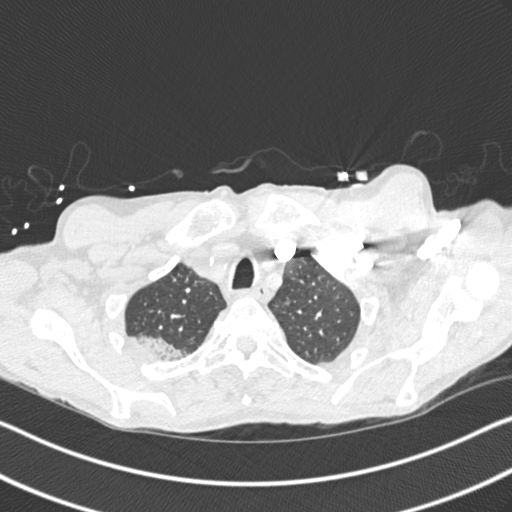
[im 238/274  soft-tissue]
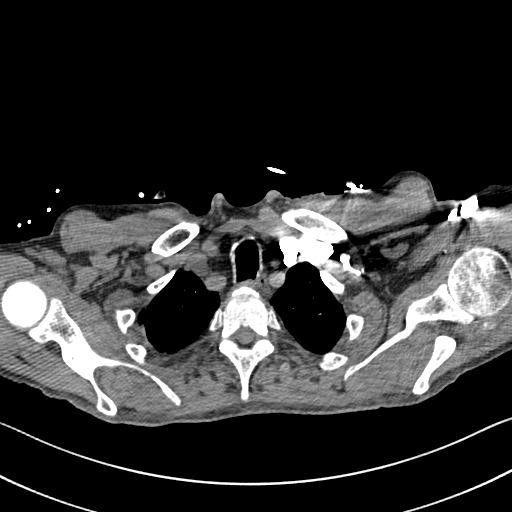
[im 262/274  lung]
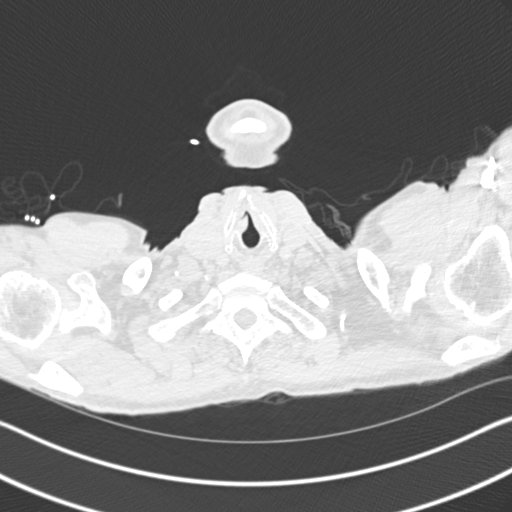

[Series 9: coronal mpr · coronal · 0.59mm/px · 3 of 151 slices shown]
[im 38/151  soft-tissue]
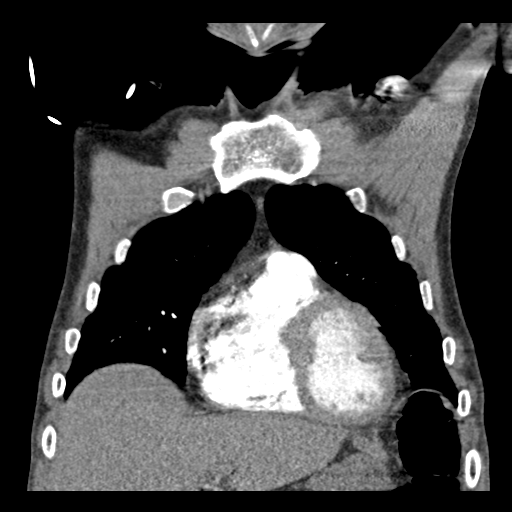
[im 76/151  soft-tissue]
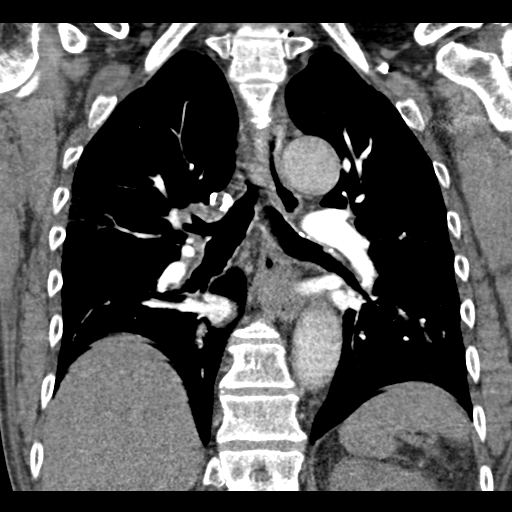
[im 113/151  soft-tissue]
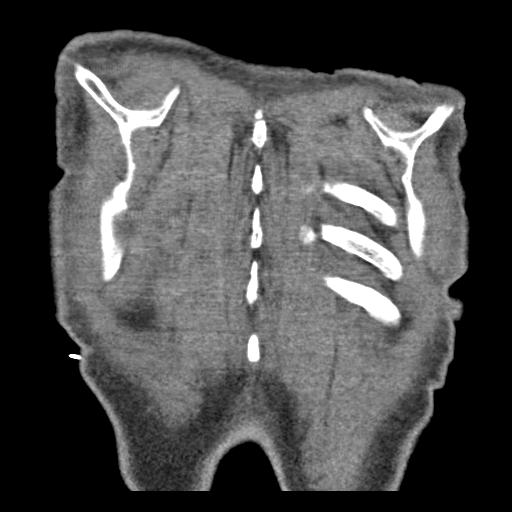

[18 of 46 positions shown; findings below may reference images not displayed]

FINDINGS: Cardiovascular: Again noted are right-sided pulmonary emboli. There
is thrombus in a segmental branch of the right upper lobe on
sequence 7, image 108. There is partial recanalization of this
vessel compared to the previous examination. Decreased clot burden
in the right lower lobe pulmonary arteries. There is still a small
amount of submental pulmonary emboli in the right lower lobe.
Markedly decreased clot burden in the left lower lobe segmental
branches. Evidence for minimal residual clot in the left lower lobe.
Main pulmonary arteries are widely patent. Heart size is stable. No
significant pericardial fluid. Atherosclerotic calcifications
involving the abdominal aorta. Coronary artery calcifications.
Proximal descending thoracic aorta measures 3.2 cm and stable.

Mediastinum/Nodes: Mediastinal structures are unremarkable without
significant lymph node enlargement. No significant thyroid tissue is
appreciated.

Lungs/Pleura: Trachea and mainstem bronchi are patent. Parenchymal
disease in the posterior right upper lobe corresponds with the large
segmental pulmonary embolism in the right upper lobe branch. The
consolidation in the posterior right upper lobe has progressed and
now there is a large amount of gas within this area of
consolidation. Findings are most compatible with pulmonary necrosis
secondary to a pulmonary infarct. Hazy patchy opacities in the right
lower lobe particularly in the superior segment of the right lower
lobe. Disease in the right lower lobe may have slightly progressed.
No significant airspace disease or consolidation in the left lung.
No large pleural effusions.

Upper Abdomen: Visualized upper abdominal structures are within
normal limits.

Musculoskeletal: No acute bone abnormality.

Review of the MIP images confirms the above findings.
IMPRESSION: 1. Progressive consolidation in the posterior right upper lobe with
evidence of necrosis. This area of necrosis and pulmonary infarct
corresponds with a large segmental pulmonary embolism from the right
upper lobe pulmonary artery. Pulmonary necrosis likely accounts for
the patient's hemoptysis.
2. Pulmonary embolism clot burden has markedly decreased. Minimal
clot remaining in the left lung. There is residual clot in the
posterior right upper lobe corresponding with the necrotic/cavitary
lung disease.
3. Slightly increased parenchymal disease in the right lower lobe is
nonspecific but could represent a developing infectious or
inflammatory process.

## 2020-10-19 IMAGING — DX DG CHEST 1V PORT
1 series · 1 of 1 positions shown · non-contrast
Comparison: 10/09/2019

CLINICAL DATA: Pulmonary infarct.

EXAM:
PORTABLE CHEST 1 VIEW

[chest ap]
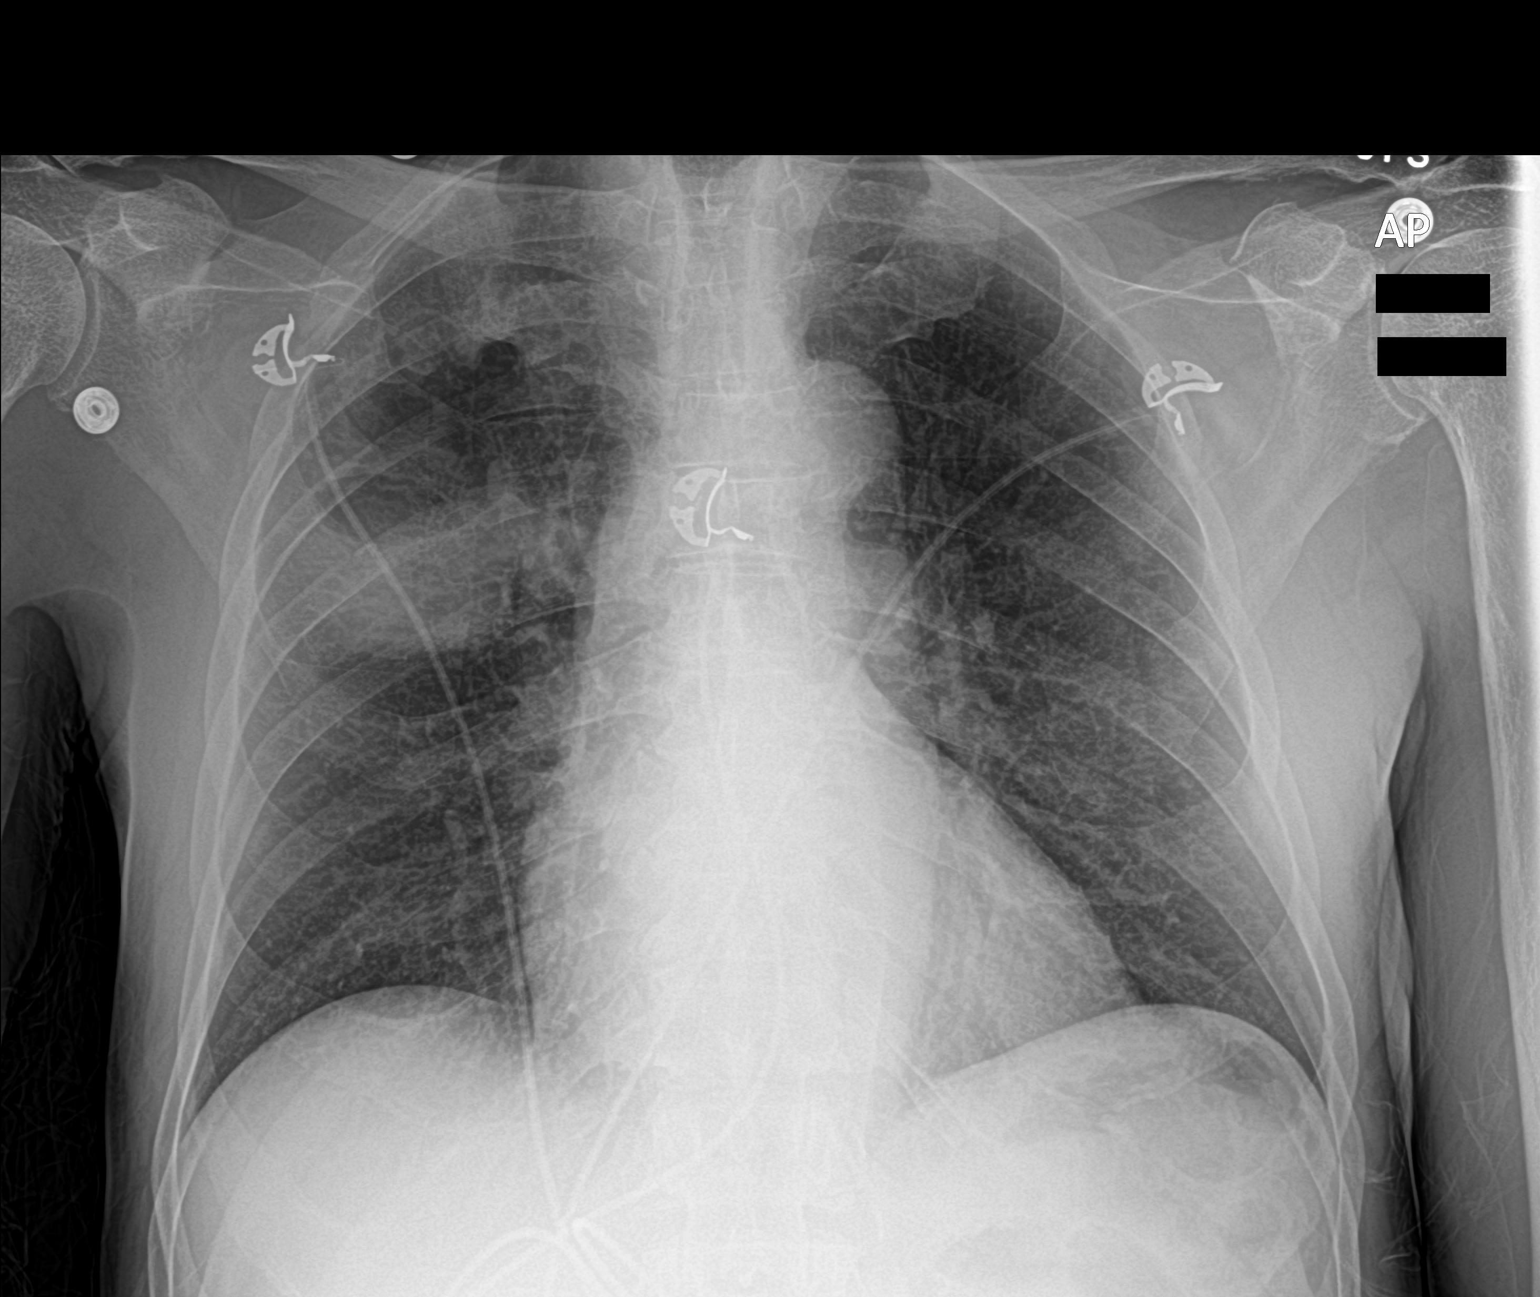

[1 of 1 positions shown; findings below may reference images not displayed]

FINDINGS: The cardiac silhouette, mediastinal and hilar contours are within
normal limits and stable.

Stable large cavitary area in the right upper lobe. The left lung
remains clear. No pleural effusions.
IMPRESSION: Stable large right upper lobe cavitary area.

## 2020-11-03 IMAGING — DX DG CHEST 2V
2 series · 2 of 2 positions shown · non-contrast
Comparison: 10/12/2019

CLINICAL DATA: Pulmonary infarct, follow-up

EXAM:
CHEST - 2 VIEW

[chest pa]
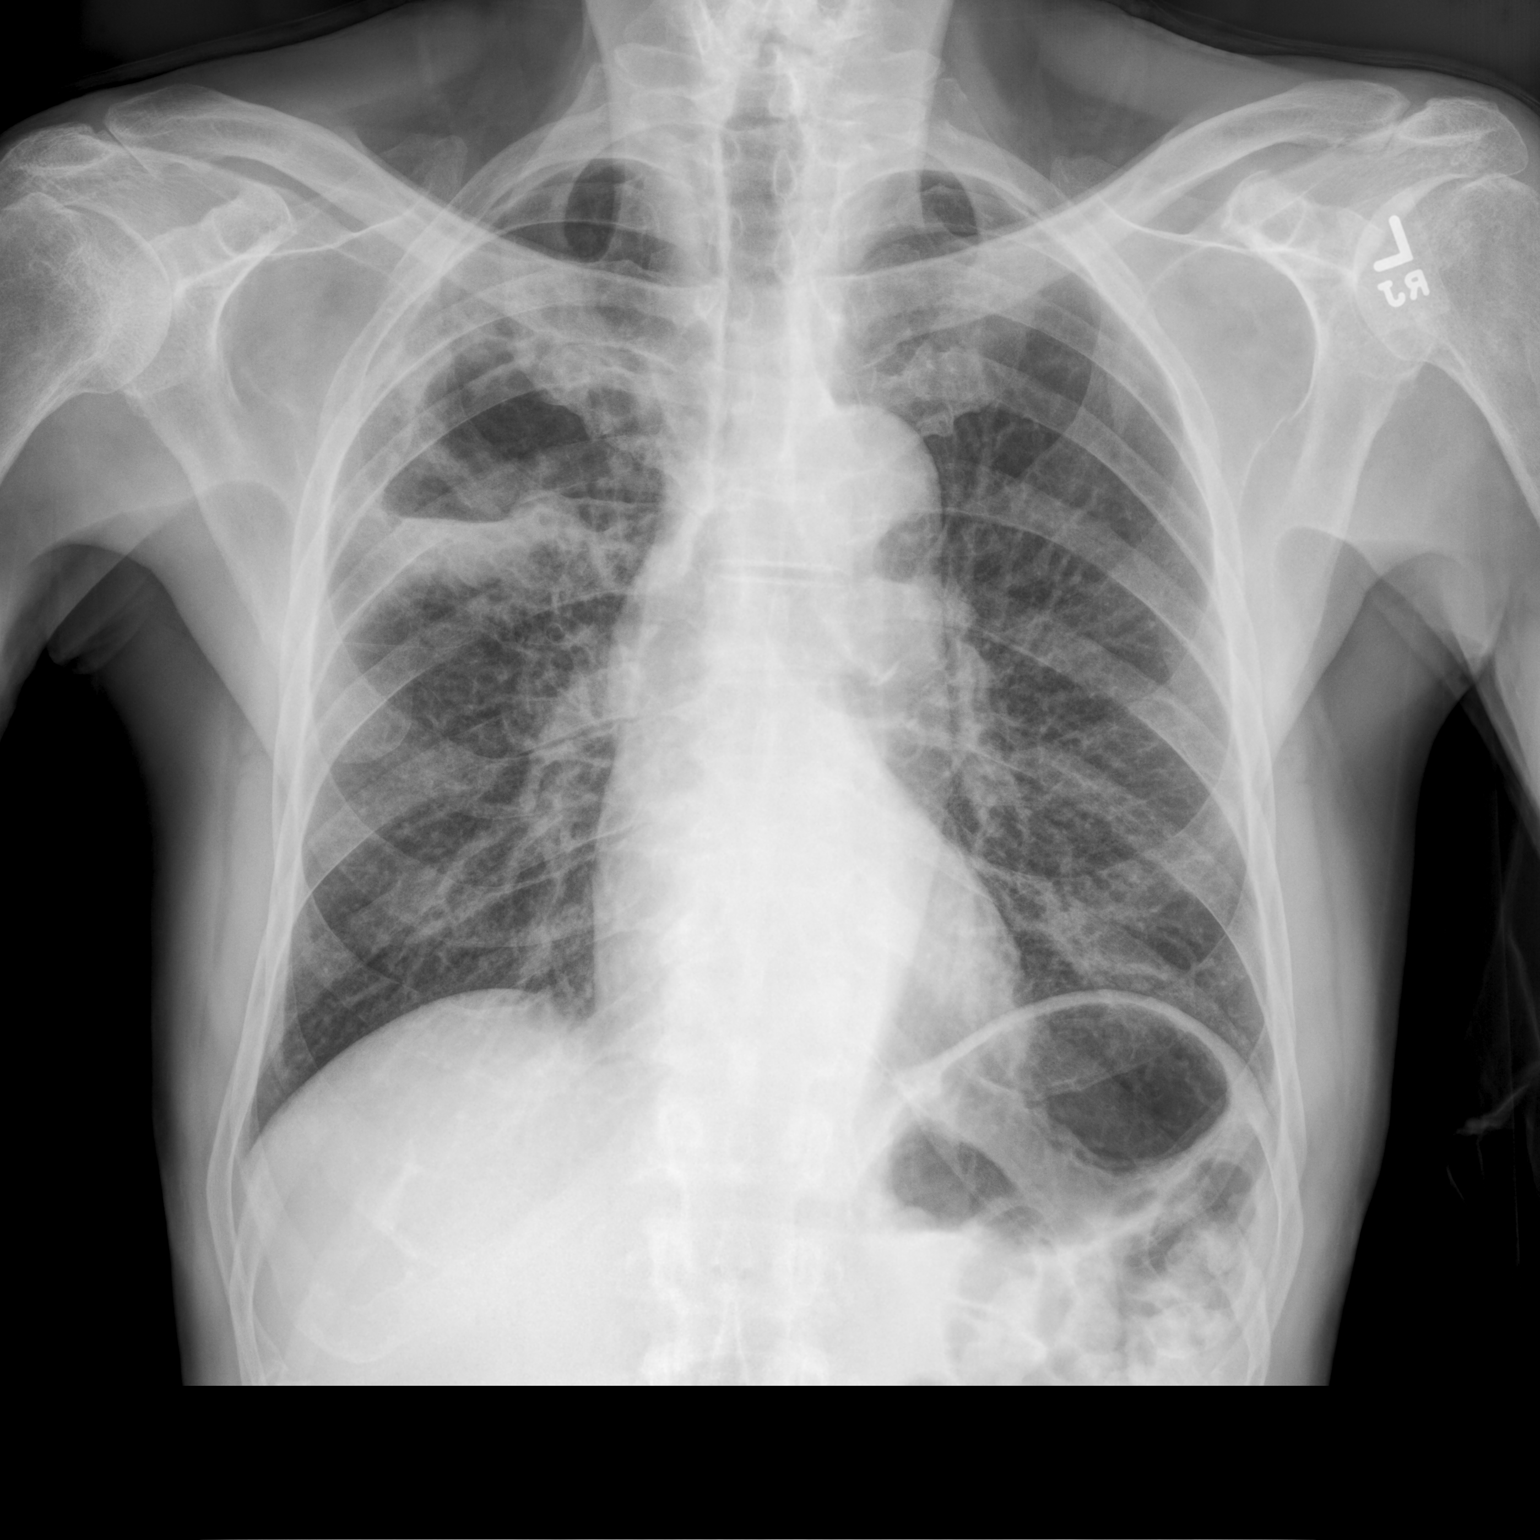

[chest lat]
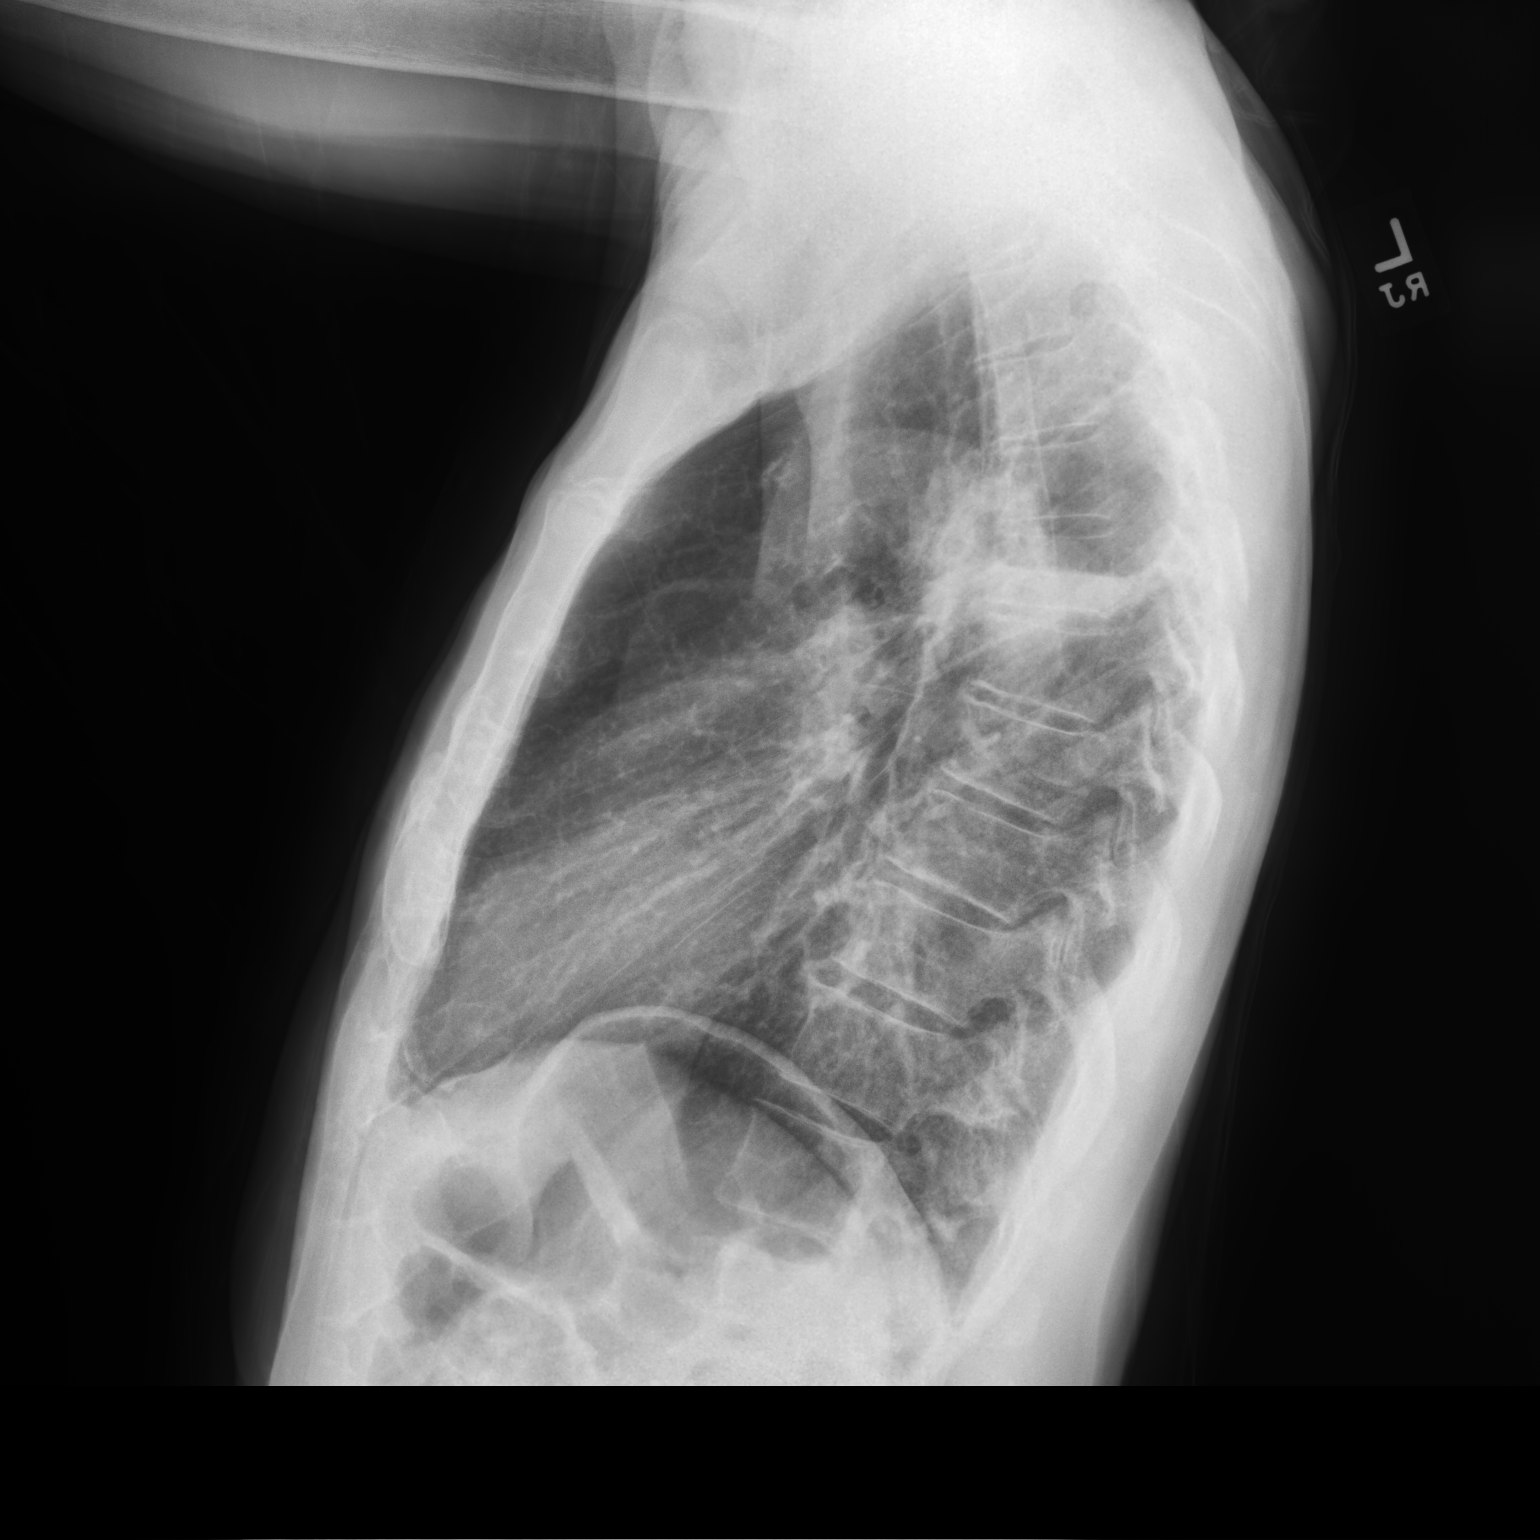

[2 of 2 positions shown; findings below may reference images not displayed]

FINDINGS: Normal heart size, mediastinal contours, and pulmonary vascularity.

Peribronchial thickening with again identified cavitary lesion in
the RIGHT upper lobe 7.2 x 6.5 x 4.7 cm consistent with abscess or
infarct with necrosis.

No underlying mass in the RIGHT upper lobe with seen on an earlier
CT exam from 09/29/2019 to suggest cavitary neoplasm.

Persistent visualization of a small air-fluid level within this
collection.

Remaining lungs clear.

No pleural effusion or pneumothorax.
IMPRESSION: Persistent cavitary lesion with air-fluid level in RIGHT upper lobe
either representing pulmonary infarct with necrosis or lung abscess.
